# Patient Record
Sex: Male | Born: 1950 | Race: White | Hispanic: No | Marital: Married | Smoking: Former smoker
Health system: Southern US, Community
[De-identification: ages and names within clinical notes are randomized; demographics above are authoritative.]

## PROBLEM LIST (undated history)

## (undated) DIAGNOSIS — H269 Unspecified cataract: Secondary | ICD-10-CM

## (undated) DIAGNOSIS — IMO0002 Reserved for concepts with insufficient information to code with codable children: Secondary | ICD-10-CM

## (undated) DIAGNOSIS — K449 Diaphragmatic hernia without obstruction or gangrene: Secondary | ICD-10-CM

## (undated) DIAGNOSIS — T7840XA Allergy, unspecified, initial encounter: Secondary | ICD-10-CM

## (undated) DIAGNOSIS — N189 Chronic kidney disease, unspecified: Secondary | ICD-10-CM

## (undated) DIAGNOSIS — K219 Gastro-esophageal reflux disease without esophagitis: Secondary | ICD-10-CM

## (undated) DIAGNOSIS — N200 Calculus of kidney: Secondary | ICD-10-CM

## (undated) DIAGNOSIS — K635 Polyp of colon: Secondary | ICD-10-CM

## (undated) HISTORY — DX: Polyp of colon: K63.5

## (undated) HISTORY — DX: Diaphragmatic hernia without obstruction or gangrene: K44.9

## (undated) HISTORY — DX: Reserved for concepts with insufficient information to code with codable children: IMO0002

## (undated) HISTORY — PX: UPPER GASTROINTESTINAL ENDOSCOPY: SHX188

## (undated) HISTORY — DX: Chronic kidney disease, unspecified: N18.9

## (undated) HISTORY — PX: CERVICAL FUSION: SHX112

## (undated) HISTORY — PX: ROTATOR CUFF REPAIR: SHX139

## (undated) HISTORY — DX: Allergy, unspecified, initial encounter: T78.40XA

## (undated) HISTORY — PX: NISSEN FUNDOPLICATION: SHX2091

## (undated) HISTORY — PX: COLONOSCOPY: SHX174

## (undated) HISTORY — DX: Gastro-esophageal reflux disease without esophagitis: K21.9

## (undated) HISTORY — DX: Unspecified cataract: H26.9

## (undated) HISTORY — DX: Calculus of kidney: N20.0

---

## 1998-12-11 ENCOUNTER — Ambulatory Visit (HOSPITAL_COMMUNITY): Admission: RE | Admit: 1998-12-11 | Discharge: 1998-12-11 | Payer: Self-pay | Admitting: Gastroenterology

## 2000-03-10 ENCOUNTER — Encounter: Payer: Self-pay | Admitting: Gastroenterology

## 2000-03-10 ENCOUNTER — Ambulatory Visit (HOSPITAL_COMMUNITY): Admission: RE | Admit: 2000-03-10 | Discharge: 2000-03-10 | Payer: Self-pay | Admitting: Gastroenterology

## 2001-07-11 ENCOUNTER — Ambulatory Visit (HOSPITAL_COMMUNITY): Admission: RE | Admit: 2001-07-11 | Discharge: 2001-07-11 | Payer: Self-pay | Admitting: Gastroenterology

## 2001-08-09 ENCOUNTER — Encounter: Payer: Self-pay | Admitting: General Surgery

## 2001-08-12 ENCOUNTER — Inpatient Hospital Stay (HOSPITAL_COMMUNITY): Admission: RE | Admit: 2001-08-12 | Discharge: 2001-08-13 | Payer: Self-pay | Admitting: General Surgery

## 2004-02-04 ENCOUNTER — Encounter: Admission: RE | Admit: 2004-02-04 | Discharge: 2004-02-04 | Payer: Self-pay | Admitting: Gastroenterology

## 2004-02-12 ENCOUNTER — Encounter: Admission: RE | Admit: 2004-02-12 | Discharge: 2004-02-12 | Payer: Self-pay | Admitting: Gastroenterology

## 2006-08-25 ENCOUNTER — Ambulatory Visit: Payer: Self-pay | Admitting: Internal Medicine

## 2006-08-25 LAB — CONVERTED CEMR LAB
ALT: 62 units/L — ABNORMAL HIGH (ref 0–40)
AST: 33 units/L (ref 0–37)
Albumin: 3.7 g/dL (ref 3.5–5.2)
Alkaline Phosphatase: 89 units/L (ref 39–117)
Anti Nuclear Antibody(ANA): NEGATIVE
Ferritin: 226.9 ng/mL (ref 22.0–322.0)

## 2007-07-07 ENCOUNTER — Ambulatory Visit (HOSPITAL_BASED_OUTPATIENT_CLINIC_OR_DEPARTMENT_OTHER): Admission: RE | Admit: 2007-07-07 | Discharge: 2007-07-07 | Payer: Self-pay | Admitting: Urology

## 2010-01-12 ENCOUNTER — Encounter: Admission: RE | Admit: 2010-01-12 | Discharge: 2010-01-12 | Payer: Self-pay | Admitting: Chiropractic Medicine

## 2010-10-13 NOTE — Op Note (Signed)
NAMEABED, SCHAR               ACCOUNT NO.:  [id number]   MEDICAL RECORD NO.:  [id number]          PATIENT TYPE:  AMB   LOCATION:  [facility]                         FACILITY:  [facility]   PHYSICIAN:  Mark C. Vernie Ammons, M.D.  DATE OF BIRTH:  29-Mar-1951   DATE OF PROCEDURE:  07/07/2007  DATE OF DISCHARGE:                               OPERATIVE REPORT   PREOPERATIVE DIAGNOSES:  Bilateral spermatoceles and gross hematuria.   POSTOPERATIVE DIAGNOSES:  1. Bilateral spermatoceles.  2. Bilateral hydroceles.  3. Urethral condyloma.   PROCEDURE:  1. Cystoscopy.  2. Fulguration of urethral condyloma.  3. Scrotal exploration.  4. Bilateral hydrocelectomy.  5. Bilateral spermatocelectomy.   SURGEON:  Dr. Ihor Gully.   ANESTHESIA:  General.   SPECIMENS:  None.   BLOOD LOSS:  Minimal.   COMPLICATIONS:  None.   INDICATIONS:  The patient is a 60 year old white male who has a history  of bilateral spermatoceles.  The right side is a little smaller but is  tender and the left side is larger. He has elected to have these  surgically treated and in addition in the preop holding area, he  indicated he was noting some blood spotting in his underwear.  He  reports a history of urethral condyloma approximately 10 years ago. The  risks, complications and alternatives were discussed.  He understands  and elected to proceed.   DESCRIPTION OF OPERATION:  After informed consent, the patient was  brought to the major OR, placed on the table,  administered general  anesthesia in the supine position and his genitalia was sterilely  prepped and draped.  An official time-out was then performed.   Flexible cystoscopy was then performed using a 17-French flexible scope.  Under direct visualization, the scope was passed down the urethra which  was noted to be entirely normal without stricture and the sphincter  appeared intact.  The prostatic urethra was free of lesions and  nonobstructing.  The bladder  was then entered and fully inspected.  It  was noted to be free of any tumor, stones or inflammatory lesions.  The  ureteral orifices were normal in configuration and position.  The  cystoscope was then removed and inspection of the urethral meatus was  performed as I saw no lesions in the urethra.  I did note a small (2 mm)  condylomatous lesion in the urethral meatus at the 6 o'clock position.  This was grasped with Adson's and the urethral meatus was spread using  nasal speculum.  I then used the Bovie to bovie the lesion off at its  base and completely removing the lesion with no bleeding occurring.   Attention was then directed to the scrotum where a midline median raphe  incision was then made and carried down to expose the parietal tunica  surrounding the right testicle.  It was noted to be translucent  containing apparent fluid and when it was opened a moderate hydrocele  was identified and drained of clear amber fluid.  I then opened this  further and delivered the right testicle.  The testicle itself was noted  to be entirely normal.  The epididymis appeared to be slightly displaced  from the testicle and a large spermatocele was identified.  I incised at  the border between the spermatocele and the epididymis circumferentially  and then was able to completely dissect the spermatocele away from the  surrounding tissue. A smaller spermatocele was then found near the head  of the epididymis and the tunica over this was incised, this was exposed  and excised as well.  I then replaced the testicle back into the  parietal tunica vaginalis and then closed this with running 3-0 chromic  suture and replaced the testicle, in its normal anatomic position, in  the right hemiscrotum.   Attention was then directed to the left side with a hydrocele being  detected on that side.  It was opened and drained of clear amber fluid  as well.  The testicle was also delivered and two larger  spermatoceles  were again seen that also appeared to be displacing the epididymis  slightly away from the testicle.  These were treated in identical  fashion to the to the right side with clear fluid being removed from  each of these.  There was another area with a small cystic lesion along  the lateral aspect of the epididymis that was unroofed and fulgurated.  The testicle was then replaced in its normal anatomic position and the  parietal tunica was closed over this again with running 3-0 chromic and  the testicle placed back in the left hemiscrotum.  I then injected 0.25%  Marcaine in the subcu tissue and closed the deep tissues with running 3-  0 chromic suture including the right and left sides as well as the  midline septum.  The skin was then closed with running 3-0 chromic and  collodion was applied as well as fluffed 4x4s and a scrotal support.  The patient was then awakened and taken to the recovery in stable  satisfactory condition.  He tolerated the procedure well. There were no  intraoperative complications.  He will be given a prescription for  Vicodin HP #36 and Keflex 500 mg #15 and follow-up in my office in 3-4  weeks.      Mark C. Vernie Ammons, M.D.  Electronically Signed     MCO/MEDQ  D:  07/07/2007  T:  07/08/2007  Job:  [id number]

## 2010-10-16 NOTE — Procedures (Signed)
[place]  Patient:    Trevor Potter, Trevor Potter Visit Number: [id number] MRN: [id number]          Service Type: END Location: ENDO Attending Physician:  Charmaine Downs Dictated by:   Vania Rea. Jarold Motto, M.D. [facility] Proc. Date: 07/24/01 Admit Date:  07/11/2001 Discharge Date: 07/11/2001   CC:         Ulyess Mort, M.D. [facility]  Adolph Pollack, M.D.   Procedure Report  PROCEDURE PERFORMED:  Esophageal manometry.  ATTENDING PHYSICIAN:  Vania Rea. Jarold Motto, M.D. [facility]  Esophageal manometry was completed on July 11, 2001.  Results are as follows:  1 - Upper esophageal sphincter.  There appears to be normal coordination between pharyngeal contraction and cricopharyngeal relaxation.  2 - Lower esophageal sphincter.  Lower esophageal sphincter pressure was markedly decreased approximately 5 mHg with normal relaxation to swallowing. The lower esophageal sphincter appears to be 3.5 cm in length.  It measures from 44.5 cm to  48 cm.  3 - Motility pattern.  There are normally propagated peristaltic waves with normal amplitude and duration throughout the length of the esophagus to both wet and dry swallows.  Mean distal esophageal amplitude is 95 mHg.  ASSESSMENT:  This manometry is normal manometry except for a very incompetent lower esophageal sphincter pressure and I think this patient should do well with fundoplication surgery. Dictated by:   Vania Rea. Jarold Motto, M.D. [facility] Attending Physician:  Charmaine Downs DD:  07/24/01 TD:  07/24/01 Job: [id number]

## 2010-10-16 NOTE — Op Note (Signed)
[place] [facility]  Patient:    Trevor Potter, Trevor Potter Visit Number: [id number] MRN: [id number]          Service Type: SUR Location: 4W 0451 01 Attending Physician:  Arlis Porta Dictated by:   Adolph Pollack, M.D. Proc. Date: 08/11/01 Admit Date:  08/11/2001 Discharge Date: 08/13/2001   CC:         Trevor Potter, M.D. [facility]   Operative Report  PREOPERATIVE DIAGNOSIS:  Hiatal hernia with gastroesophageal reflux disease.  POSTOPERATIVE DIAGNOSIS:  Hiatal hernia with gastroesophageal reflux disease.  PROCEDURE:  Laparoscopic Nissen fundoplication and hiatal hernia repair.  SURGEON:  Adolph Pollack, M.D.  ASSISTANT:  Angelia Mould. Derrell Lolling, M.D.  ANESTHESIA:  General.  INDICATION:  Trevor Potter is a 60 year old man with known gastroesophageal reflux disease and hiatal hernia.  He has esophagitis.  Manometry demonstrated a decreased lower esophageal sphincter pressure.  He still has difficulty with reflux despite medical therapy and now presents of antireflux operation.  The procedure and the risks were explained to him preoperatively.  Also of note, preoperatively, he had some elevation of his liver function tests, but he states that Dr. Corinda Potter has noted this and has been evaluating him for this but has not found a specific cause for it.  TECHNIQUE:  He is brought to the operating room, placed supine on the operating table, and a general anesthetic was administered.  The abdominal wall was shaved and sterilely prepped and draped.  Dilute Marcaine was infiltrated in the subumbilical region, and an incision made in the skin and subcutaneous tissue.  The midline fascia was identified and incised.  Using blunt dissection, the peritoneal cavity was entered bluntly and under direct vision.  A pursestring suture of 0 Vicryl was placed around the fascial edges. A Hasson trocar was introduced to the peritoneal cavity and pneumoperitoneum created by  insufflation of CO2 gas.  Next, the 30 degree laparoscope was introduced.  Liver looked normal.  There were some adhesions from the omentum to the gallbladder.  Stomach appeared normal and the anterior surface of it.  Under direct vision, a 5 mm trocar was placed in the right mid abdomen through which a liver retractor was inserted.  After the left lobe of the liver was retracted superiorly and toward the right shoulder, the gastroesophageal junction could be visualized.  A 5 mm retractor and an 11 mm retractor was therefore then placed in the right subcostal region.  A 5 mm retractor was placed in the left subcostal region.  Gastroesophageal junction was grasped and retracted toward the left.  The thin gastrohepatic omentum was incised all the way up to the level of the right crus.  The anterior phrenicoesophageal ligament was incised across to the left crus.  The left crus was then mobilized free from the esophagus.  I then mobilized the right crus free from the esophagus and began the partial creation of the retroesophageal window.  Next, I approached the mid portion of the fundus, grasped it, and divided short gastric vessels.  The stomach was fairly adherent to the spleen, but I was able to mobilize this with the harmonic scalpel.  I skeletonized the left crus.  I was then able to create the retroesophageal window.  I noted that he had a small to moderate sized hiatal hernia.  I subsequently placed three 0 nonabsorbable sutures in the hiatal hernia, tied them down, and this closed the hernia.  It was not under any tension.  Next, I grasped  the proximal fundus and brought it through the retroesophageal window.  I then approximated the left leaf to the right leaf of the wrap and did the shoe shine maneuver.  The wrap was under no tension.  Following this, a size 50 Bougie was passed down in the esophagus into the stomach.  I then performed a 360 degree fundoplication over the size 50  Bougie.  Three nonabsorbable sutures were used.  The first two sutures encompassed the left leaf of the wrap, the anterior surface of the esophagus, and the right leaf in the wrap.  The third suture just encompassed the right and left leafs of the wrap.  The dilator was then removed.  The wrap was floppy and under no tension.  It measured approximately 2-2.5 cm.  I then inspected the area and noted no bleeding.  The wrap sutures lay at approximately the 11 oclock position, and I marked the two proximal sutures with the hemoclips.  I then removed the liver retractor, then removed the trocars, and released the pneumoperitoneum.  The supraumbilical fascia defect was closed by tightening up and tying down the pursestring suture.  The right subcostal 11 mm port site was closed using a single 0 Vicryl to close the fascial defect.  The skin was then closed with 4-0 Monocryl subcuticular stitches followed by Steri-Strips and sterile dressings.  He tolerated the procedure well without any apparent complications.  He was taken to the recovery room in satisfactory condition. Dictated by:   Adolph Pollack, M.D. Attending Physician:  Arlis Porta DD:  08/11/01 TD:  08/14/01 Job: [id number]

## 2011-02-09 ENCOUNTER — Encounter: Payer: Self-pay | Admitting: Gastroenterology

## 2011-02-19 LAB — POCT HEMOGLOBIN-HEMACUE
Hemoglobin: 15.1
Operator id: 114531

## 2011-03-02 ENCOUNTER — Ambulatory Visit (AMBULATORY_SURGERY_CENTER): Payer: BC Managed Care – PPO

## 2011-03-02 VITALS — Ht 73.0 in | Wt 257.7 lb

## 2011-03-02 DIAGNOSIS — Z1211 Encounter for screening for malignant neoplasm of colon: Secondary | ICD-10-CM

## 2011-03-02 DIAGNOSIS — Z8371 Family history of colonic polyps: Secondary | ICD-10-CM

## 2011-03-02 MED ORDER — PEG-KCL-NACL-NASULF-NA ASC-C 100 G PO SOLR: 1.0000 | Freq: Once | ORAL | Status: AC

## 2011-03-03 ENCOUNTER — Encounter: Payer: Self-pay | Admitting: Gastroenterology

## 2011-03-12 ENCOUNTER — Ambulatory Visit (AMBULATORY_SURGERY_CENTER): Payer: BC Managed Care – PPO | Admitting: Gastroenterology

## 2011-03-12 ENCOUNTER — Encounter: Payer: Self-pay | Admitting: Gastroenterology

## 2011-03-12 DIAGNOSIS — K5730 Diverticulosis of large intestine without perforation or abscess without bleeding: Secondary | ICD-10-CM

## 2011-03-12 DIAGNOSIS — Z1211 Encounter for screening for malignant neoplasm of colon: Secondary | ICD-10-CM

## 2011-03-12 DIAGNOSIS — D126 Benign neoplasm of colon, unspecified: Secondary | ICD-10-CM

## 2011-03-12 DIAGNOSIS — Z86010 Personal history of colonic polyps: Secondary | ICD-10-CM

## 2011-03-12 DIAGNOSIS — K635 Polyp of colon: Secondary | ICD-10-CM

## 2011-03-12 HISTORY — DX: Polyp of colon: K63.5

## 2011-03-12 MED ORDER — SODIUM CHLORIDE 0.9 % IV SOLN: 500.0000 mL | INTRAVENOUS | Status: DC

## 2011-03-12 NOTE — Patient Instructions (Signed)
Please refer to your blue and neon green sheets for instructions regarding diet and activity for the rest of today.  You may resume your medications as you would normally take them.   Diverticulosis Diverticulosis is a common condition that develops when small pouches (diverticula) form in the wall of the colon. The risk of diverticulosis increases with age. It happens more often in people who eat a low-fiber diet. Most individuals with diverticulosis have no symptoms. Those individuals with symptoms usually experience belly (abdominal) pain, constipation, or loose stools (diarrhea). HOME CARE INSTRUCTIONS  Increase the amount of fiber in your diet as directed by your caregiver or dietician. This may reduce symptoms of diverticulosis.   Your caregiver may recommend taking a dietary fiber supplement.   Drink at least 6 to 8 glasses of water each day to prevent constipation.   Try not to strain when you have a bowel movement.   Your caregiver may recommend avoiding nuts and seeds to prevent complications, although this is still an uncertain benefit.   Only take over-the-counter or prescription medicines for pain, discomfort, or fever as directed by your caregiver.  FOODS HAVING HIGH FIBER CONTENT INCLUDE:  Fruits. Apple, peach, pear, tangerine, raisins, prunes.   Vegetables. Brussels sprouts, asparagus, broccoli, cabbage, carrot, cauliflower, romaine lettuce, spinach, summer squash, tomato, winter squash, zucchini.   Starchy Vegetables. Baked beans, kidney beans, lima beans, split peas, lentils, potatoes (with skin).   Grains. Whole wheat bread, brown rice, bran flake cereal, plain oatmeal, white rice, shredded wheat, bran muffins.  SEEK IMMEDIATE MEDICAL CARE IF:  You develop increasing pain or severe bloating.   You have an increased oral temperature, not controlled by medicine.   You develop vomiting or bowel movements that are bloody or black.  Document Released: 02/12/2004  Document Re-Released: 11/04/2009 [facility] Patient Information 2011 [place], [place].  Polyps, Colon  A polyp is extra tissue that grows inside your body. Colon polyps grow in the large intestine. The large intestine, also called the colon, is part of your digestive system. It is a long, hollow tube at the end of your digestive tract where your body makes and stores stool. Most polyps are not dangerous. They are benign. This means they are not cancerous. But over time, some types of polyps can turn into cancer. Polyps that are smaller than a pea are usually not harmful. But larger polyps could someday become or may already be cancerous. To be safe, doctors remove all polyps and test them.  WHO GETS POLYPS? Anyone can get polyps, but certain people are more likely than others. You may have a greater chance of getting polyps if:  You are over 50.   You have had polyps before.   Someone in your family has had polyps.   Someone in your family has had cancer of the large intestine.   Find out if someone in your family has had polyps. You may also be more likely to get polyps if you:   Eat a lot of fatty foods   Smoke   Drink alcohol   Do not exercise  Eat too much  SYMPTOMS Most small polyps do not cause symptoms. People often do not know they have one until their caregiver finds it during a regular checkup or while testing them for something else. Some people do have symptoms like these:  Bleeding from the anus. You might notice blood on your underwear or on toilet paper after you have had a bowel movement.   Constipation  or diarrhea that lasts more than a week.   Blood in the stool. Blood can make stool look black or it can show up as red streaks in the stool.  If you have any of these symptoms, see your caregiver. HOW DOES THE DOCTOR TEST FOR POLYPS? The doctor can use four tests to check for polyps:  Digital rectal exam. The caregiver wears gloves and checks your rectum (the last  part of the large intestine) to see if it feels normal. This test would find polyps only in the rectum. Your caregiver may need to do one of the other tests listed below to find polyps higher up in the intestine.   Barium enema. The caregiver puts a liquid called barium into your rectum before taking x-rays of your large intestine. Barium makes your intestine look white in the pictures. Polyps are dark, so they are easy to see.   Sigmoidoscopy. With this test, the caregiver can see inside your large intestine. A thin flexible tube is placed into your rectum. The device is called a sigmoidoscope, which has a light and a tiny video camera in it. The caregiver uses the sigmoidoscope to look at the last third of your large intestine.   Colonoscopy. This test is like sigmoidoscopy, but the caregiver looks at all of the large intestine. It usually requires sedation. This is the most common method for finding and removing polyps.  TREATMENT  The caregiver will remove the polyp during sigmoidoscopy or colonoscopy. The polyp is then tested for cancer.   If you have had polyps, your caregiver may want you to get tested regularly in the future.  PREVENTION There is not one sure way to prevent polyps. You might be able to lower your risk of getting them if you:  Eat more fruits and vegetables and less fatty food.   Do not smoke.   Avoid alcohol.   Exercise every day.   Lose weight if you are overweight.   Eating more calcium and folate can also lower your risk of getting polyps. Some foods that are rich in calcium are milk, cheese, and broccoli. Some foods that are rich in folate are chickpeas, kidney beans, and spinach.   Aspirin might help prevent polyps. Studies are under way.  Document Released: 02/11/2004 Document Re-Released: 11/04/2009 [facility] Patient Information 2011 [place], [place].

## 2011-03-15 ENCOUNTER — Telehealth: Payer: Self-pay | Admitting: *Deleted

## 2011-03-15 NOTE — Telephone Encounter (Signed)
No id on machine, no message left  

## 2012-07-18 ENCOUNTER — Ambulatory Visit (INDEPENDENT_AMBULATORY_CARE_PROVIDER_SITE_OTHER): Payer: BC Managed Care – PPO | Admitting: Physician Assistant

## 2012-07-18 ENCOUNTER — Telehealth: Payer: Self-pay | Admitting: Gastroenterology

## 2012-07-18 ENCOUNTER — Other Ambulatory Visit (INDEPENDENT_AMBULATORY_CARE_PROVIDER_SITE_OTHER): Payer: BC Managed Care – PPO

## 2012-07-18 ENCOUNTER — Encounter: Payer: Self-pay | Admitting: *Deleted

## 2012-07-18 VITALS — BP 124/84 | HR 72 | Ht 73.0 in | Wt 239.8 lb

## 2012-07-18 DIAGNOSIS — R1011 Right upper quadrant pain: Secondary | ICD-10-CM

## 2012-07-18 DIAGNOSIS — K5730 Diverticulosis of large intestine without perforation or abscess without bleeding: Secondary | ICD-10-CM

## 2012-07-18 DIAGNOSIS — R1013 Epigastric pain: Secondary | ICD-10-CM

## 2012-07-18 DIAGNOSIS — R110 Nausea: Secondary | ICD-10-CM

## 2012-07-18 DIAGNOSIS — Z09 Encounter for follow-up examination after completed treatment for conditions other than malignant neoplasm: Secondary | ICD-10-CM

## 2012-07-18 DIAGNOSIS — Z9889 Other specified postprocedural states: Secondary | ICD-10-CM

## 2012-07-18 LAB — COMPREHENSIVE METABOLIC PANEL
ALT: 56 U/L — ABNORMAL HIGH (ref 0–53)
Albumin: 3.9 g/dL (ref 3.5–5.2)
Alkaline Phosphatase: 89 U/L (ref 39–117)
Calcium: 9.4 mg/dL (ref 8.4–10.5)
Chloride: 109 mEq/L (ref 96–112)
Glucose, Bld: 89 mg/dL (ref 70–99)
Potassium: 4.4 mEq/L (ref 3.5–5.1)
Sodium: 141 mEq/L (ref 135–145)
Total Protein: 6.4 g/dL (ref 6.0–8.3)

## 2012-07-18 LAB — LIPASE: Lipase: 32 U/L (ref 11.0–59.0)

## 2012-07-18 LAB — CBC WITH DIFFERENTIAL/PLATELET
Eosinophils Absolute: 0.2 10*3/uL (ref 0.0–0.7)
Lymphs Abs: 1.7 10*3/uL (ref 0.7–4.0)
MCHC: 34.4 g/dL (ref 30.0–36.0)
MCV: 93 fl (ref 78.0–100.0)
Monocytes Relative: 4.9 % (ref 3.0–12.0)
RBC: 4.63 Mil/uL (ref 4.22–5.81)
WBC: 8.2 10*3/uL (ref 4.5–10.5)

## 2012-07-18 MED ORDER — OMEPRAZOLE 20 MG PO CPDR: 20.0000 mg | DELAYED_RELEASE_CAPSULE | Freq: Two times a day (BID) | ORAL | Status: DC

## 2012-07-18 NOTE — Progress Notes (Signed)
Reviewed and agree with management. Danyella Mcginty D. Florencia Zaccaro, M.D., FACG  

## 2012-07-18 NOTE — Telephone Encounter (Signed)
Left message for pt to call back.  Pt states he has been having a lot of abdominal pain, gas, and sharp pain under his rib cage on the right side. Pt thinks it may be his gallbladder. Pt requesting to be seen. Pt scheduled to see Mike Gip PA today at 11am. Pt aware of appt date and time.

## 2012-07-18 NOTE — Patient Instructions (Addendum)
Please go to the basement level to have your labs drawn.  We have given you Prilosec 20 mg, Take 1 tab  30 min before breakfast. Today you can take 1 tab now and then 1 tab before dinner tonight.  We scheduled the Ultrasound at [facility] Radiology first floor for tomorrow 2-19 at 8 Am. Arrive at 7:45 am.  Have nothing by mouth after midnight.

## 2012-07-18 NOTE — Progress Notes (Signed)
Subjective:    Patient ID: Trevor Potter, male    DOB: August 30, 1950, 62 y.o.   MRN: [id number]  HPI Trevor Potter is a very nice 62 year old white male known to Dr. Arlyce Dice from prior colonoscopy. 62 He had colonoscopy in 2012 which showed mild diverticular disease of the sigmoid colon and one 3 mm polyp was removed this was a benign prolapse type polyp. He is status post remote Nissen fundoplication per Dr. Abbey Chatters approximally 10 years ago and says that this has worked very well and he still does not have any recurrent reflux symptoms. He relates that he was told at the time of his Nissen fundoplication that his gallbladder appeared scarred and would probably eventually need to come out. He says he has been having pain in the epigastrium and right upper quadrant over the past 4-5 days persistently ,this is a new pain that he has not ever had before. He describes it as burning, pressure and a bloated sensation sometimes sharp in the right upper quadrant. He has not had any radiation to his back or chest. He says he feels better if he eats more frequently and this seems to ease the pain, however within an hour or so it comes right back again. He has tried over-the-counter Gaviscon etc. - says hasn't touched it. He has been nauseated intermittently and wishes that he could vomit but has  unable been unable to vomit since his Nissen. No changes in his bowel habits melena or hematochezia. No documented fever or chills. He takes occasional NSAIDs but not on a regular basis and one baby aspirin per day. He is worried about his gallbladder and says the pain has been persistent and quite uncomfortable.    Review of Systems  Constitutional: Negative.   HENT: Negative.   Eyes: Negative.   Respiratory: Negative.   Cardiovascular: Negative.   Gastrointestinal: Positive for nausea and abdominal pain.  Endocrine: Negative.   Genitourinary: Negative.   Allergic/Immunologic: Negative.   Neurological: Negative.    Hematological: Negative.   Psychiatric/Behavioral: Negative.    Allergies  Allergen Reactions  . Ivp Dye (Iodinated Diagnostic Agents) Nausea And Vomiting  . Aleve (Naproxen Sodium) Rash       Outpatient Prescriptions Prior to Visit  Medication Sig Dispense Refill  . aspirin 81 MG tablet Take 81 mg by mouth daily.         No facility-administered medications prior to visit.   Patient Active Problem List  Diagnosis  . S/P Nissen fundoplication (without gastrostomy tube) procedure  . Diverticulosis of colon without hemorrhage   History  Substance Use Topics  . Smoking status: Former Smoker    Types: Cigarettes    Quit date: 03/01/1989  . Smokeless tobacco: Never Used  . Alcohol Use: 0.0 oz/week     Comment: 1-2 drinks daily   family history includes Cirrhosis in his father; Clotting disorder in his mother; and Heart disease in his brother and sister.  There is no history of Colon cancer and Colon polyps.  Objective:   Physical Exam well-developed white male in no acute distress, pleasant blood pressure 124/84 pulse 72 height 6 foot 1 weight 239. HEENT; nontraumatic normocephalic EOMI PERRLA sclera anicteric,Neck; Supple no JVD, Cardiovascular; regular rate and rhythm with S1-S2 no murmur or gallop, Pulmonary; clear bilaterally, Abdomen; soft he is tender in the epigastrium and right upper quadrant no guarding or rebound no palpable mass or hepatosplenomegaly bowel sounds are present, Rectal; exam not done, Extremities; no clubbing cyanosis or edema skin  warm and dry, Psych; mood and affect normal and appropriate        Assessment & Plan:  #54 62 year old male status post remote Nissen fundoplication, otherwise in good health now presenting with acute epigastric and right upper quadrant abdominal pain associated with nausea over the past 4-5 days. Will rule out gallbladder disease versus gastritis or peptic ulcer disease. #2 mild sigmoid diverticulosis #3 colon neoplasia  surveillance-up-to-date with colonoscopy last done 2012 with only one small benign prolapse type polyp  Plan; will check CBC with differential, CMET and lipase  Bland diet Prilosec OTC 20 mg by mouth daily in the short-term Schedule for upper abdominal ultrasound in the next 24 hours, further plans pending results of labs and ultrasound.

## 2012-07-19 ENCOUNTER — Ambulatory Visit (HOSPITAL_COMMUNITY)
Admission: RE | Admit: 2012-07-19 | Discharge: 2012-07-19 | Disposition: A | Discharge status: Home / Self Care | Payer: BC Managed Care – PPO | Source: Ambulatory Visit | Attending: Physician Assistant | Admitting: Physician Assistant

## 2012-07-19 DIAGNOSIS — Q619 Cystic kidney disease, unspecified: Secondary | ICD-10-CM | POA: Insufficient documentation

## 2012-07-19 DIAGNOSIS — R1011 Right upper quadrant pain: Secondary | ICD-10-CM

## 2012-07-19 DIAGNOSIS — R110 Nausea: Secondary | ICD-10-CM

## 2012-07-19 DIAGNOSIS — R1013 Epigastric pain: Secondary | ICD-10-CM

## 2012-10-30 ENCOUNTER — Other Ambulatory Visit: Payer: Self-pay | Admitting: Neurosurgery

## 2012-10-30 DIAGNOSIS — M5412 Radiculopathy, cervical region: Secondary | ICD-10-CM

## 2012-11-24 ENCOUNTER — Ambulatory Visit
Admission: RE | Admit: 2012-11-24 | Discharge: 2012-11-24 | Disposition: A | Discharge status: Home / Self Care | Payer: BC Managed Care – PPO | Source: Ambulatory Visit | Attending: Neurosurgery | Admitting: Neurosurgery

## 2012-11-24 VITALS — BP 118/73 | HR 64

## 2012-11-24 DIAGNOSIS — M5412 Radiculopathy, cervical region: Secondary | ICD-10-CM

## 2012-11-24 MED ORDER — IOHEXOL 300 MG/ML  SOLN
10.0000 mL | Freq: Once | INTRAMUSCULAR | Status: AC | PRN
  Administered 2012-11-24: 10 mL via INTRATHECAL

## 2012-11-24 MED ORDER — DIAZEPAM 5 MG PO TABS
10.0000 mg | ORAL_TABLET | Freq: Once | ORAL | Status: AC
  Administered 2012-11-24: 10 mg via ORAL

## 2013-09-18 ENCOUNTER — Other Ambulatory Visit: Payer: Self-pay | Admitting: Dermatology

## 2015-01-27 IMAGING — CT CT CERVICAL SPINE W/ CM
3 of 4 series · 13 of 33 positions shown, 16 images · non-contrast
Comparison: none

CLINICAL DATA: Cervical radiculopathy.  Prior discontinuous
cervical fusions with neck pain.
TECHNIQUE: Contiguous axial images were obtained through the
Cervical spine without infusion. Coronal and sagittal
reconstructions were obtained of the axial image sets.

[Series 2: c spine bone · axial · 0.27mm/px · z∈[-59,+73]mm · 5 of 81 slices shown, 7 images]
[im 14/81  soft-tissue]
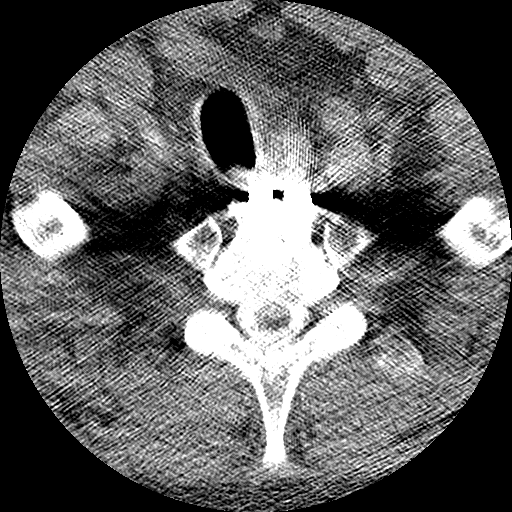
[im 14/81  bone]
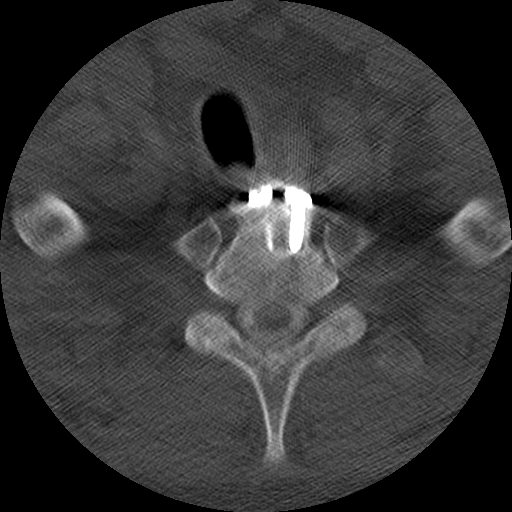
[im 27/81  bone]
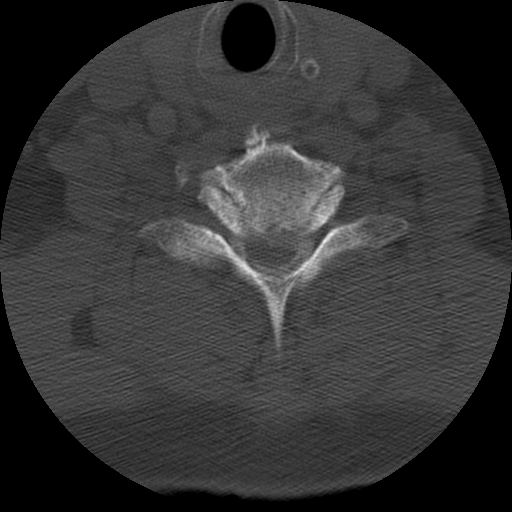
[im 41/81  bone]
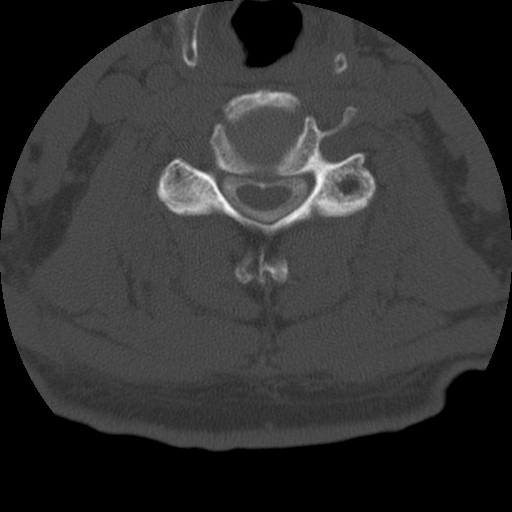
[im 54/81  bone]
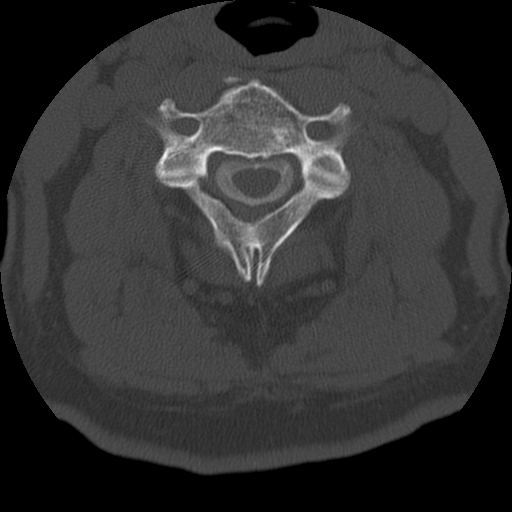
[im 67/81  soft-tissue]
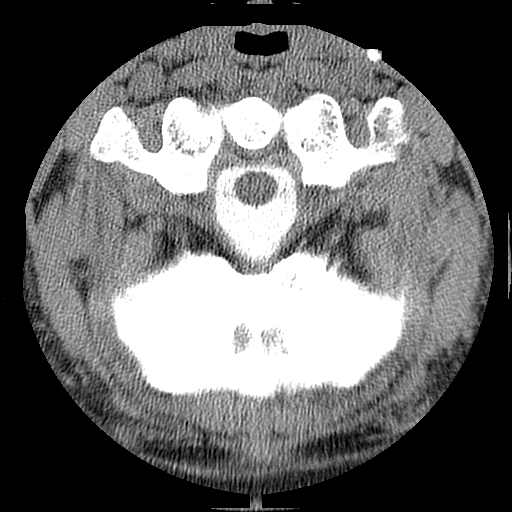
[im 67/81  bone]
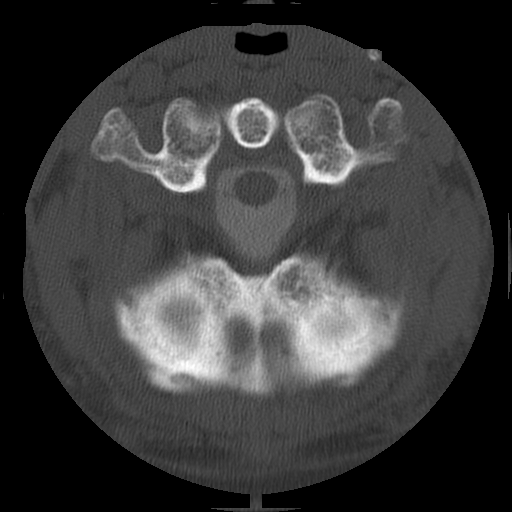

[Series 400: cor · coronal · 0.40mm/px · 3 of 50 slices shown]
[im 10/50  bone]
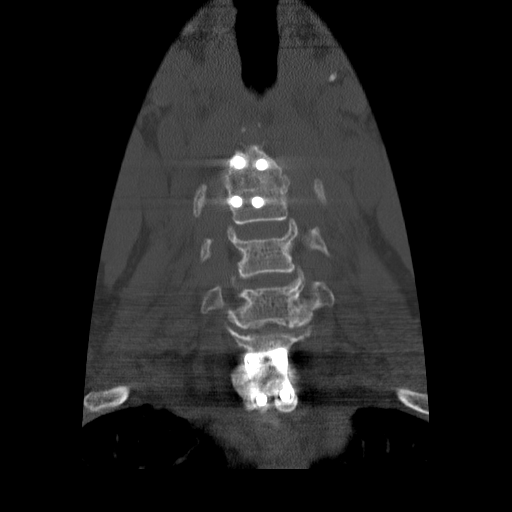
[im 20/50  bone]
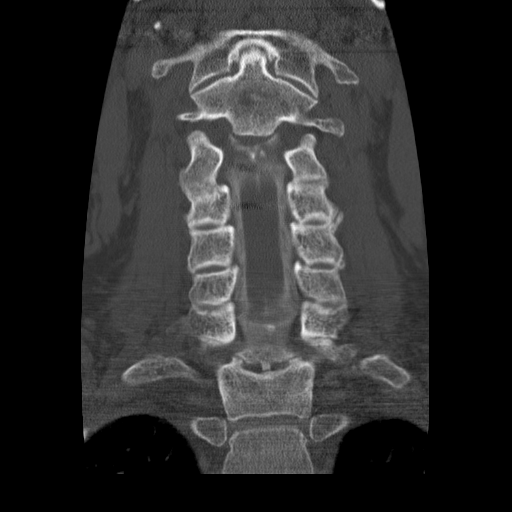
[im 30/50  bone]
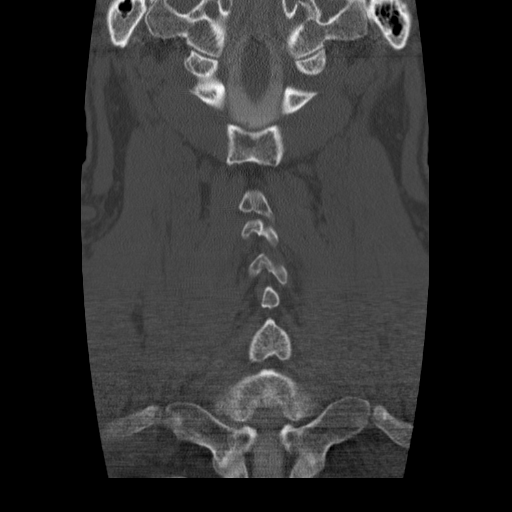

[Series 401: sag · sagittal · 0.40mm/px · 5 of 50 slices shown, 6 images]
[im 17/50  bone]
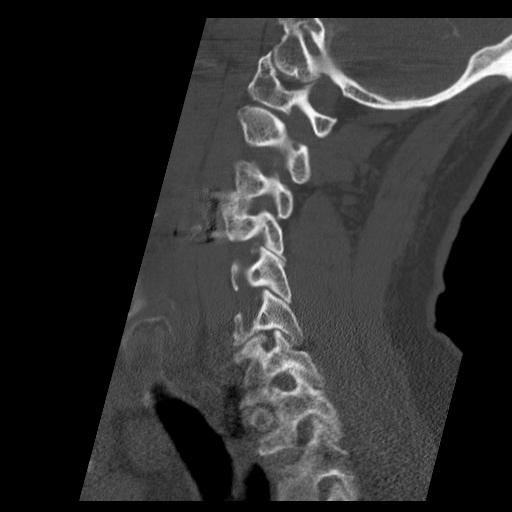
[im 21/50  bone]
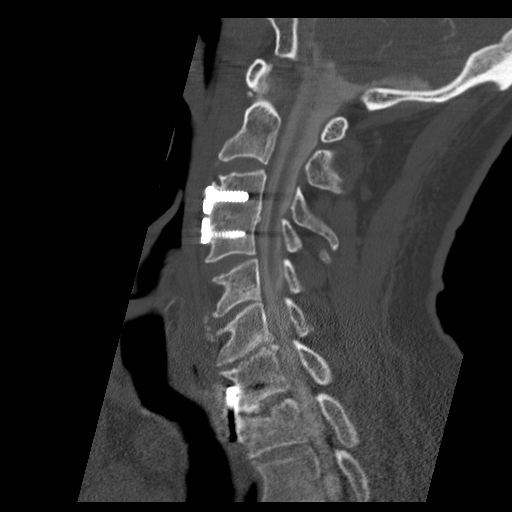
[im 25/50  soft-tissue]
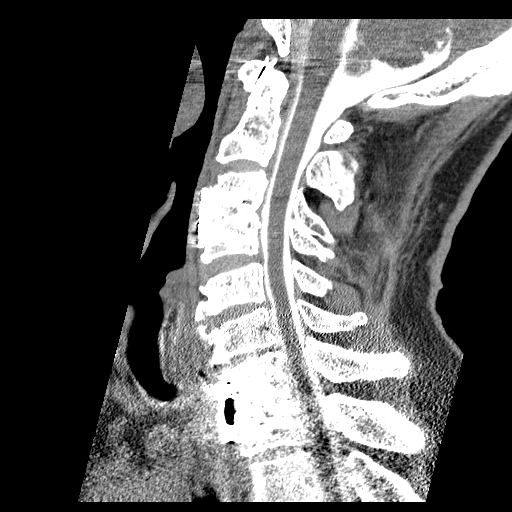
[im 25/50  bone]
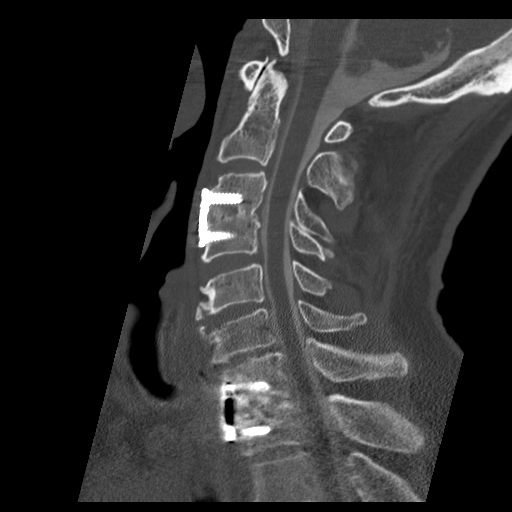
[im 29/50  bone]
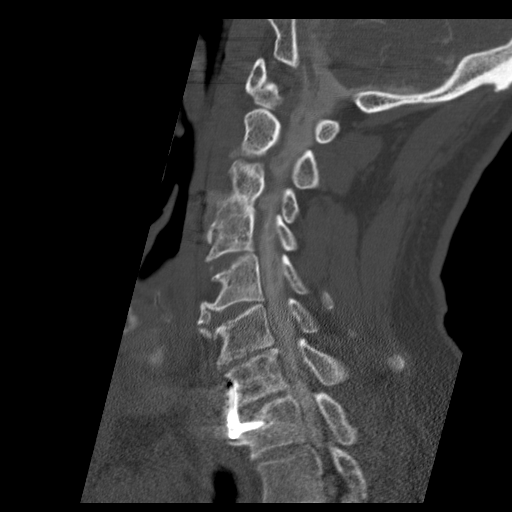
[im 33/50  bone]
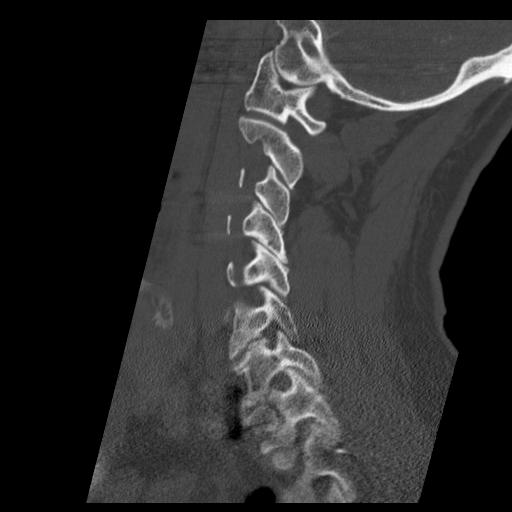

[13 of 33 positions shown; findings below may reference images not displayed]

LUMBAR PUNCTURE FOR CERVICAL MYELOGRAM

 Procedure: After thorough discussion of risks and benefits of the
procedure including bleeding, infection, injury to nerves, blood
vessels, adjacent structures as well as headache and CSF leak,
written and oral informed consent was obtained.   Consent was
obtained by Dr.Magdaleno. We discussed the high likelihood of obtaining
a diagnostic study.

Patient was positioned prone on the fluoroscopy table. Local
anesthesia was provided with 1% lidocaine without epinephrine after
prepped and draped in the usual sterile fashion. Puncture was
performed at L3-L4 using a 3-1/2 inches pencil point 22-gauge
spinal needle via right paramedian approach.  Using a single pass
through the dura, the needle was placed within the thecal sac, with
return of clear CSF. 8 mL of Bmnipaque-1BB was injected into the
thecal sac, with normal opacification of the nerve roots and cauda
equina consistent with free flow within the subarachnoid space.

Fluoroscopy time: 1 minute 34 seconds

I personally performed the lumbar puncture and administered the
intrathecal contrast. I also personally supervised acquisition of
the myelogram images.
FINDINGS: Solid C3-C4 cervical fusion is present.  No hardware
complication.  The the C7-T1 fusion is poorly visualized due to
overlapping soft tissues and will be evaluated further CT.  There
is no spondylolisthesis identified.  No instability identified with
flexion and extension of the cervical spine.  Degenerative disc
disease is moderate to severe at C5-C6 and C6-C7.  Prominent
anterior spurring is present.  C4-C5 adjacent segment facet
arthrosis is present.  Mild central stenosis is noted C5-C6 with
disc osteophyte complex producing anterior impression on the thecal
sac.
IMPRESSION: 1.  Technically successful lumbar puncture at L3-L4 with pencil
point needle.
2.  Solid C3-C4 fusion with ACDF plate.
3.  C5-C6 and C6-C7 cervical spondylosis which will be detailed
further below.
4.  Poor visualization of C7-T1 ACDF.

CT CERVICAL MYELOGRAM
FINDINGS: There is no cervical spondylolisthesis.  No aggressive
osseous lesions.  Paraspinal soft tissues appear within normal
limits.  Lung apices are normal.  Craniocervical alignment normal.
Atlantodental degenerative disease.

C2-C3:  Normal facet joints.  No stenosis.  Disc height preserved.

C3-C4:  Solid fusion.  No hardware complication.  No recurrent
stenosis.

C4-C5:  Small central disc protrusion with minimal central
stenosis.  No cord deformity.  Left greater than right facet
degeneration which is mild. Tiny vacuum joint present bilaterally.
The foramina appear adequately patent.  Mild central osseous
ridging involving the inferior C4.

C5-C6:  Disc degeneration with vacuum disc and collapse of the disc
space.  Mild central stenosis with shallow right eccentric broad-
based bulging.  AP diameter of the thecal sac is 9 mm.  There is
mild left foraminal encroachment associated with uncovertebral
spurring.

C6-C7:  Mild central stenosis associated with broad-based disc
osteophyte complex.  There is bilateral foraminal stenosis
associated with uncovertebral spurring. There is mild flattening
the ventral cord associated with broad-based disc osteophyte
complex.

C7-T1:  There is no solid fusion.  There is no gas within the disc
space to suggest pseudoarthrosis.  There is a tiny amount of
lucency around the T1 ACDF screws raising the possibility of
ongoing motion.  There may be a small amount of anterior bridging
bone (image number 11 series [DATE]) adjacent to the ACDF plate.
There is no recurrent central stenosis.  The foramina appear
adequately patent.  Mild left greater than right facet
degeneration.

T1-T2:  Grossly normal.  Suboptimal evaluation due to artifact.
IMPRESSION: 1.  Solid C3-C4 fusion without recurrent stenosis.
2.  Mild C5-C6 degenerative disc disease with mild central
stenosis.  Very mild left foraminal encroachment of questionable
significance.
3.  C6-C7 disc degeneration with broad-based disc osteophyte
complex producing mild central stenosis with flattening the ventral
cervical cord. Bilateral foraminal stenosis, greater on the right
than left associated with uncovertebral spurring.
4.  Delayed union at C7-T1 ACDF.  Faint lucency is present around
the T1 screws suggesting ongoing motion.  There appears to be a
tiny amount of bridging bone anteriorly across the disc space.  No
recurrent stenosis.

## 2016-10-18 ENCOUNTER — Observation Stay (HOSPITAL_COMMUNITY)
Admission: EM | Admit: 2016-10-18 | Discharge: 2016-10-19 | Disposition: A | Discharge status: Home / Self Care | Payer: BLUE CROSS/BLUE SHIELD | Attending: Internal Medicine | Admitting: Internal Medicine

## 2016-10-18 ENCOUNTER — Encounter (HOSPITAL_COMMUNITY): Payer: Self-pay | Admitting: Emergency Medicine

## 2016-10-18 ENCOUNTER — Emergency Department (HOSPITAL_COMMUNITY): Payer: BLUE CROSS/BLUE SHIELD

## 2016-10-18 DIAGNOSIS — N182 Chronic kidney disease, stage 2 (mild): Secondary | ICD-10-CM | POA: Diagnosis present

## 2016-10-18 DIAGNOSIS — I2510 Atherosclerotic heart disease of native coronary artery without angina pectoris: Secondary | ICD-10-CM | POA: Diagnosis not present

## 2016-10-18 DIAGNOSIS — I129 Hypertensive chronic kidney disease with stage 1 through stage 4 chronic kidney disease, or unspecified chronic kidney disease: Secondary | ICD-10-CM | POA: Insufficient documentation

## 2016-10-18 DIAGNOSIS — R030 Elevated blood-pressure reading, without diagnosis of hypertension: Secondary | ICD-10-CM | POA: Insufficient documentation

## 2016-10-18 DIAGNOSIS — Z8249 Family history of ischemic heart disease and other diseases of the circulatory system: Secondary | ICD-10-CM | POA: Diagnosis not present

## 2016-10-18 DIAGNOSIS — Z86010 Personal history of colonic polyps: Secondary | ICD-10-CM | POA: Diagnosis not present

## 2016-10-18 DIAGNOSIS — K219 Gastro-esophageal reflux disease without esophagitis: Secondary | ICD-10-CM | POA: Insufficient documentation

## 2016-10-18 DIAGNOSIS — I259 Chronic ischemic heart disease, unspecified: Secondary | ICD-10-CM | POA: Diagnosis not present

## 2016-10-18 DIAGNOSIS — Z87891 Personal history of nicotine dependence: Secondary | ICD-10-CM | POA: Diagnosis not present

## 2016-10-18 DIAGNOSIS — Z886 Allergy status to analgesic agent status: Secondary | ICD-10-CM | POA: Insufficient documentation

## 2016-10-18 DIAGNOSIS — R072 Precordial pain: Principal | ICD-10-CM | POA: Insufficient documentation

## 2016-10-18 DIAGNOSIS — Z91041 Radiographic dye allergy status: Secondary | ICD-10-CM | POA: Diagnosis not present

## 2016-10-18 DIAGNOSIS — K5730 Diverticulosis of large intestine without perforation or abscess without bleeding: Secondary | ICD-10-CM | POA: Diagnosis not present

## 2016-10-18 DIAGNOSIS — R079 Chest pain, unspecified: Secondary | ICD-10-CM | POA: Diagnosis not present

## 2016-10-18 DIAGNOSIS — M542 Cervicalgia: Secondary | ICD-10-CM | POA: Diagnosis not present

## 2016-10-18 DIAGNOSIS — Z7982 Long term (current) use of aspirin: Secondary | ICD-10-CM | POA: Insufficient documentation

## 2016-10-18 DIAGNOSIS — Z79899 Other long term (current) drug therapy: Secondary | ICD-10-CM | POA: Insufficient documentation

## 2016-10-18 LAB — BASIC METABOLIC PANEL
Anion gap: 8 (ref 5–15)
BUN: 17 mg/dL (ref 6–20)
CHLORIDE: 108 mmol/L (ref 101–111)
CO2: 24 mmol/L (ref 22–32)
Calcium: 9.1 mg/dL (ref 8.9–10.3)
Creatinine, Ser: 1.3 mg/dL — ABNORMAL HIGH (ref 0.61–1.24)
GFR calc Af Amer: >60 mL/min (ref >60)
GFR calc non Af Amer: 56 mL/min — ABNORMAL LOW (ref >60)
GLUCOSE: 101 mg/dL — AB (ref 65–99)
POTASSIUM: 4 mmol/L (ref 3.5–5.1)
Sodium: 140 mmol/L (ref 135–145)

## 2016-10-18 LAB — CBC
HEMATOCRIT: 43.1 % (ref 39.0–52.0)
Hemoglobin: 15.1 g/dL (ref 13.0–17.0)
MCH: 31.9 pg (ref 26.0–34.0)
MCHC: 35 g/dL (ref 30.0–36.0)
MCV: 91.1 fL (ref 78.0–100.0)
Platelets: 245 10*3/uL (ref 150–400)
RBC: 4.73 MIL/uL (ref 4.22–5.81)
RDW: 12.8 % (ref 11.5–15.5)
WBC: 9.7 10*3/uL (ref 4.0–10.5)

## 2016-10-18 LAB — I-STAT TROPONIN, ED: Troponin i, poc: 0 ng/mL (ref 0.00–0.08)

## 2016-10-18 LAB — TROPONIN I

## 2016-10-18 MED ORDER — LORATADINE 10 MG PO TABS
10.0000 mg | ORAL_TABLET | Freq: Every day | ORAL | Status: DC
  Administered 2016-10-19: 10 mg via ORAL
  Filled 2016-10-18: qty 1

## 2016-10-18 MED ORDER — ACETAMINOPHEN 325 MG PO TABS: 650.0000 mg | ORAL_TABLET | ORAL | Status: DC | PRN

## 2016-10-18 MED ORDER — MORPHINE SULFATE (PF) 4 MG/ML IV SOLN: 2.0000 mg | INTRAVENOUS | Status: DC | PRN

## 2016-10-18 MED ORDER — ASPIRIN 81 MG PO CHEW: 81.0000 mg | CHEWABLE_TABLET | Freq: Every day | ORAL | Status: DC

## 2016-10-18 MED ORDER — ENOXAPARIN SODIUM 40 MG/0.4ML ~~LOC~~ SOLN
40.0000 mg | SUBCUTANEOUS | Status: DC
  Administered 2016-10-18: 40 mg via SUBCUTANEOUS
  Filled 2016-10-18: qty 0.4

## 2016-10-18 MED ORDER — ONDANSETRON HCL 4 MG/2ML IJ SOLN: 4.0000 mg | Freq: Four times a day (QID) | INTRAMUSCULAR | Status: DC | PRN

## 2016-10-18 MED ORDER — ASPIRIN EC 81 MG PO TBEC
243.0000 mg | DELAYED_RELEASE_TABLET | Freq: Once | ORAL | Status: AC
  Administered 2016-10-18: 243 mg via ORAL
  Filled 2016-10-18: qty 3

## 2016-10-18 NOTE — ED Triage Notes (Signed)
Pt complaint of intermittent left/mid chest pressure/tightness with associated ringing in left ear and just not feeling good since Thursday.

## 2016-10-18 NOTE — H&P (Signed)
Trevor Potter DUK:[id number] DOB: December 29, 1950 DOA: 10/18/2016     PCP: Briscoe Deutscher, MD   Outpatient Specialists: [facility]  Patient coming from: home Lives   With family   Chief Complaint: Chest pain  HPI: Trevor Potter is a 66 y.o. male with medical history significant of  remote Nissen fundoplication 3762, GERD, tobacco abuse in the past, CKD    Presented with 4 day history of intermittent versus mid chest pain worse with exertion and sometimes associated ringing in his ear overall he has not been feeling well for past few days. No dyspnea, today had some tingling in left arm.  Today he felt something was not right fatigued. Reports good PO intake.  This episodes occurring at rest as well feels a dull versus pressure-like.  When it happened so last few hours but then goes away all by itself.  Pain is mild today but lasted for few hours.  He did had some sweating associated this. She reported that his brother had a heart attack when he was 47s in that worries him.  He himself never had any coronary artery disease that he knows of.  Has not had any cough nausea vomiting denies headache DVT his arms. he has been told in the past that his blood pressure been elevated but he does not take any medicines for it. He has been working in the yard lately and some times when he carries heavy things he has had some muscle pains in his chest.  Currently chest pain free Reports last stress test was 20 yeas ago and was negative.  IN ER:  Temp (24hrs), Avg:98.4 F (36.9 C), Min:98.4 F (36.9 C), Max:98.4 F (36.9 C)    RR 12 satting 96% HR 69 BP 128/82 NA 140 K4.0 creatinine 1.3 up from 1.2 in 2014 Troponin 0.00 WBC 9.7 hemoglobin 15.1 platelets 245 Chest x-ray nonacute Following Medications were ordered in ER: Medications  aspirin EC tablet 243 mg (243 mg Oral Given 10/18/16 1831)     Hospitalist was called for admission for chest pain evaluation  Review of Systems:    Pertinent positives  include: Diaphoresis. fatigue, chest pain,   Constitutional:  No weight loss, night sweats, Fevers, chills,  weight loss  HEENT:  No headaches, Difficulty swallowing,Tooth/dental problems,Sore throat,  No sneezing, itching, ear ache, nasal congestion, post nasal drip,  Cardio-vascular:  NoOrthopnea, PND, anasarca, dizziness, palpitations.no Bilateral lower extremity swelling  GI:  No heartburn, indigestion, abdominal pain, nausea, vomiting, diarrhea, change in bowel habits, loss of appetite, melena, blood in stool, hematemesis Resp:  no shortness of breath at rest. No dyspnea on exertion, No excess mucus, no productive cough, No non-productive cough, No coughing up of blood.No change in color of mucus.No wheezing. Skin:  no rash or lesions. No jaundice GU:  no dysuria, change in color of urine, no urgency or frequency. No straining to urinate.  No flank pain.  Musculoskeletal:  No joint pain or no joint swelling. No decreased range of motion. No back pain.  Psych:  No change in mood or affect. No depression or anxiety. No memory loss.  Neuro: no localizing neurological complaints, no tingling, no weakness, no double vision, no gait abnormality, no slurred speech, no confusion  As per HPI otherwise 10 point review of systems negative.   Past Medical History: Past Medical History:  Diagnosis Date  . Chronic kidney disease   . Colon polyp 03/12/2011   Sigmoid polyp  . GERD (gastroesophageal reflux disease)   .  Ulcer    Past Surgical History:  Procedure Laterality Date  . CERVICAL FUSION     with plates and screws  . COLONOSCOPY    . NISSEN FUNDOPLICATION    . UPPER GASTROINTESTINAL ENDOSCOPY       Social History:  Ambulatory  independently      reports that he quit smoking about 27 years ago. His smoking use included Cigarettes. He has never used smokeless tobacco. He reports that he drinks alcohol. He reports that he does not use drugs.  Allergies:   Allergies    Allergen Reactions  . Ivp Dye [Iodinated Diagnostic Agents] Nausea And Vomiting  . Aleve [Naproxen Sodium] Itching and Rash     Family History:   Family History  Problem Relation Age of Onset  . Clotting disorder Mother   . Cirrhosis Father   . Heart disease Sister   . Heart disease Brother   . Colon cancer Neg Hx   . Colon polyps Neg Hx     Medications: Prior to Admission medications   Medication Sig Start Date End Date Taking? Authorizing Provider  aspirin 81 MG tablet Take 81 mg by mouth daily.     Yes [provider]  cetirizine (ZYRTEC) 10 MG tablet Take 10 mg by mouth daily.   Yes [provider]  Multiple Vitamin (MULTIVITAMIN) tablet Take 1 tablet by mouth daily as needed.   Yes [provider]  omeprazole (PRILOSEC) 20 MG capsule Take 1 capsule (20 mg total) by mouth 2 (two) times daily. 07/18/12 07/18/13  Alfredia Ferguson, PA-C    Physical Exam: Patient Vitals for the past 24 hrs:  BP Temp Temp src Pulse Resp SpO2  10/18/16 1800 128/82 - - 69 12 96 %  10/18/16 1730 128/87 - - 77 16 96 %  10/18/16 1618 (!) 149/85 98.4 F (36.9 C) Oral 83 16 97 %    1. General:  in No Acute distress 2. Psychological: Alert and   Oriented 3. Head/ENT:   Dry Mucous Membranes                          Head Non traumatic, neck supple                          Normal  Dentition 4. SKIN:  decreased Skin turgor,  Skin clean Dry and intact no rash 5. Heart: Regular rate and rhythm no  Murmur, Rub or gallop 6. Lungs: no wheezes or crackles   7. Abdomen: Soft,  non-tender, Non distended 8. Lower extremities: no clubbing, cyanosis, or edema 9. Neurologically Grossly intact, moving all 4 extremities equally  10. MSK: Normal range of motion   body mass index is unknown because there is no height or weight on file.  Labs on Admission:   Labs on Admission: I have personally reviewed following labs and imaging studies  CBC:  Recent Labs Lab 10/18/16 1627   WBC 9.7  HGB 15.1  HCT 43.1  MCV 91.1  PLT 448   Basic Metabolic Panel:  Recent Labs Lab 10/18/16 1627  NA 140  K 4.0  CL 108  CO2 24  GLUCOSE 101*  BUN 17  CREATININE 1.30*  CALCIUM 9.1   GFR: CrCl cannot be calculated (Unknown ideal weight.). Liver Function Tests: No results for input(s): AST, ALT, ALKPHOS, BILITOT, PROT, ALBUMIN in the last 168 hours. No results for input(s): LIPASE, AMYLASE in  the last 168 hours. No results for input(s): AMMONIA in the last 168 hours. Coagulation Profile: No results for input(s): INR, PROTIME in the last 168 hours. Cardiac Enzymes: No results for input(s): CKTOTAL, CKMB, CKMBINDEX, TROPONINI in the last 168 hours. BNP (last 3 results) No results for input(s): PROBNP in the last 8760 hours. HbA1C: No results for input(s): HGBA1C in the last 72 hours. CBG: No results for input(s): GLUCAP in the last 168 hours. Lipid Profile: No results for input(s): CHOL, HDL, LDLCALC, TRIG, CHOLHDL, LDLDIRECT in the last 72 hours. Thyroid Function Tests: No results for input(s): TSH, T4TOTAL, FREET4, T3FREE, THYROIDAB in the last 72 hours. Anemia Panel: No results for input(s): VITAMINB12, FOLATE, FERRITIN, TIBC, IRON, RETICCTPCT in the last 72 hours. Urine analysis: No results found for: COLORURINE, APPEARANCEUR, LABSPEC, PHURINE, GLUCOSEU, HGBUR, BILIRUBINUR, KETONESUR, PROTEINUR, UROBILINOGEN, NITRITE, LEUKOCYTESUR Sepsis Labs: @LABRCNTIP (procalcitonin:4,lacticidven:4) )No results found for this or any previous visit (from the past 240 hour(s)).     UA  not ordered  No results found for: HGBA1C  CrCl cannot be calculated (Unknown ideal weight.).  BNP (last 3 results) No results for input(s): PROBNP in the last 8760 hours.   ECG REPORT  Independently reviewed Rate: 85  Rhythm: NSR ST&T Change: Poor R wave progression QTC 419  There were no vitals filed for this visit.   Cultures: No results found for: SDES, [place],  CULT, REPTSTATUS   Radiological Exams on Admission: Dg Chest 2 View  Result Date: 10/18/2016 CLINICAL DATA:  Intermittent left chest pain EXAM: CHEST  2 VIEW COMPARISON:  None. FINDINGS: The heart size and mediastinal contours are within normal limits. Both lungs are clear. The visualized skeletal structures are unremarkable. IMPRESSION: No acute abnormality noted. Electronically Signed   By: Inez Catalina M.D.   On: 10/18/2016 18:26    Chart has been reviewed    Assessment/Plan   66 y.o. male with medical history significant of  remote Nissen fundoplication 4315, GERD, tobacco abuse in the past, CKD admitted for chest pain evaluation  Present on Admission: . Chest pain - - given risk factors will admit, monitor on telemetry, cycle cardiac enzymes, obtain serial ECG. Further risk stratify with lipid panel, hgA1C, obtain TSH. Make sure patient is on Aspirin. Further treatment based on the currently pending results.  We will email cardiology given risk factors and chest pain worsened by exertion . CKD (chronic kidney disease), stage II stable currently at baseline GERD - stable  Other plan as per orders.  DVT prophylaxis:   Lovenox     Code Status:  FULL CODE care as per patient    Family Communication:   Family  at  Bedside  plan of care was discussed with Wife,    Disposition Plan:     To home once workup is complete and patient is stable                             Consults called: email cardiology   Admission status:  obs   Level of care     tele          I have spent a total of 56 min on this admission  Jerimiah Wolman 10/18/2016, 8:24 PM    [facility]  Pager [phone]   after 2 AM please page floor coverage PA If 7AM-7PM, please contact the day team taking care of the patient  [email or web address]  Password [email or web address]

## 2016-10-18 NOTE — ED Provider Notes (Signed)
[facility] DEPT Provider Note   CSN: [id number] Arrival date & time: 10/18/16  1610     History   Chief Complaint Chief Complaint  Patient presents with  . Chest Pain    HPI Trevor Potter is a 66 y.o. male 1 week of intermittent left-sided chest pain. He describes pain as a "dull, pressure" to the left side and midsternal chest region. He states that episodes of pain have occurred with rest and with activity and he denies any particular event that in folks for pain. He does note that when he has the chest pain is worsened with exertion Has not affected by.. He states that the episodes of pain last several hours before spontaneously resolving. He has not tried any medications to help with resolution of pain. Today he reports that he has had left-sided chest pain for the last few hours. He states that it is very mild but did notice that he had some diaphoresis with the pain. He also reports that he is having some tingling to his left upper extremity that began today. He denies any recent heavy lifting or new exercise routines. Patient has history of GERD but has had a Nissen Fundoplication and states the current symptoms feel different than what he experienced GERD. He reports that he used to smoke a pack a day of cigarettes for 20 years but stopped 30 years ago. He reports that his brother had a heart attack when he was 43 and that several members of the family have cardiovascular disease. Patient denies a personal history of CAD or MI, hyperlipidemia, diabetes. He denies any recent fever or recent illness. Patient denies any recent travel, periods of immobilization, surgeries, personal history of blood clots in his legs or lungs.   The history is provided by the patient.    Past Medical History:  Diagnosis Date  . Chronic kidney disease   . Colon polyp 03/12/2011   Sigmoid polyp  . GERD (gastroesophageal reflux disease)   . Ulcer     Patient Active Problem List   Diagnosis Date  Noted  . Chest pain 10/18/2016  . Elevated blood pressure reading 10/18/2016  . S/P Nissen fundoplication (without gastrostomy tube) procedure 07/18/2012  . Diverticulosis of colon without hemorrhage 07/18/2012    Past Surgical History:  Procedure Laterality Date  . CERVICAL FUSION     with plates and screws  . COLONOSCOPY    . NISSEN FUNDOPLICATION    . UPPER GASTROINTESTINAL ENDOSCOPY         Home Medications    Prior to Admission medications   Medication Sig Start Date End Date Taking? Authorizing Provider  aspirin 81 MG tablet Take 81 mg by mouth daily.     Yes [provider]  cetirizine (ZYRTEC) 10 MG tablet Take 10 mg by mouth daily.   Yes [provider]  Multiple Vitamin (MULTIVITAMIN) tablet Take 1 tablet by mouth daily as needed.   Yes [provider]  omeprazole (PRILOSEC) 20 MG capsule Take 1 capsule (20 mg total) by mouth 2 (two) times daily. 07/18/12 07/18/13  Alfredia Ferguson, PA-C    Family History Family History  Problem Relation Age of Onset  . Clotting disorder Mother   . Cirrhosis Father   . Heart disease Sister   . Heart disease Brother   . Colon cancer Neg Hx   . Colon polyps Neg Hx     Social History Social History  Substance Use Topics  . Smoking status: Former Smoker  Types: Cigarettes    Quit date: 03/01/1989  . Smokeless tobacco: Never Used  . Alcohol use 0.0 oz/week     Comment: 1-2 drinks daily     Allergies   Ivp dye [iodinated diagnostic agents] and Aleve [naproxen sodium]   Review of Systems Review of Systems  Constitutional: Positive for diaphoresis. Negative for chills and fever.  HENT: Negative for congestion and sore throat.   Eyes: Negative for visual disturbance.  Respiratory: Negative for cough and shortness of breath.   Cardiovascular: Positive for chest pain.  Gastrointestinal: Negative for abdominal pain, blood in stool, diarrhea, nausea and vomiting.  Genitourinary: Negative for  dysuria and hematuria.  Musculoskeletal: Negative for back pain and neck pain.  Skin: Negative for rash.  Neurological: Negative for dizziness, weakness, numbness and headaches.  Psychiatric/Behavioral: Negative for confusion.  All other systems reviewed and are negative.    Physical Exam Updated Vital Signs BP 128/82   Pulse 69   Temp 98.4 F (36.9 C) (Oral)   Resp 12   SpO2 96%   Physical Exam  Constitutional: He is oriented to person, place, and time. He appears well-developed and well-nourished.  Sitting comfortably on examination table  HENT:  Head: Normocephalic and atraumatic.  Mouth/Throat: Oropharynx is clear and moist and mucous membranes are normal.  Eyes: Conjunctivae, EOM and lids are normal. Pupils are equal, round, and reactive to light.  Neck: Full passive range of motion without pain.  Cardiovascular: Normal rate, regular rhythm, normal heart sounds and normal pulses.  Exam reveals no gallop and no friction rub.   No murmur heard. Pulmonary/Chest: Effort normal and breath sounds normal. He exhibits tenderness.  Tenderness palpation to anterior left-sided chest.  Abdominal: Soft. Normal appearance. There is no tenderness. There is no rigidity and no guarding.  Musculoskeletal: Normal range of motion.  Neurological: He is alert and oriented to person, place, and time.  Cranial nerves III-XII intact Follows commands, Moves all extremities  5/5 strength to BUE and BLE  Sensation intact throughout  Normal finger to nose. No dysdiadochokinesia. No pronator drift. No slurred speech. No facial droop.   Skin: Skin is warm and dry. Capillary refill takes less than 2 seconds.  Psychiatric: He has a normal mood and affect. His speech is normal.  Nursing note and vitals reviewed.    ED Treatments / Results  Labs (all labs ordered are listed, but only abnormal results are displayed) Labs Reviewed  BASIC METABOLIC PANEL - Abnormal; Notable for the following:        Result Value   Glucose, Bld 101 (*)    Creatinine, Ser 1.30 (*)    GFR calc non Af Amer 56 (*)    All other components within normal limits  CBC  I-STAT TROPOININ, ED    EKG  EKG Interpretation  Date/Time:  Monday Oct 18 2016 16:17:02 EDT Ventricular Rate:  85 PR Interval:    QRS Duration: 89 QT Interval:  352 QTC Calculation: 419 R Axis:   3 Text Interpretation:  Sinus rhythm Abnormal R-wave progression, early transition No significant change since last tracing Confirmed by YAO  MD, DAVID ([id number]) on 10/18/2016 5:39:37 PM       Radiology Dg Chest 2 View  Result Date: 10/18/2016 CLINICAL DATA:  Intermittent left chest pain EXAM: CHEST  2 VIEW COMPARISON:  None. FINDINGS: The heart size and mediastinal contours are within normal limits. Both lungs are clear. The visualized skeletal structures are unremarkable. IMPRESSION: No acute abnormality noted. Electronically  Signed   By: Inez Catalina M.D.   On: 10/18/2016 18:26    Procedures Procedures (including critical care time)  Medications Ordered in ED Medications  aspirin EC tablet 243 mg (243 mg Oral Given 10/18/16 1831)     Initial Impression / Assessment and Plan / ED Course  I have reviewed the triage vital signs and the nursing notes.  Pertinent labs & imaging results that were available during my care of the patient were reviewed by me and considered in my medical decision making (see chart for details).     66 year old male who presents with 1 week of intermittent left-sided chest pain. Consider ACS versus musculoskeletal pain. History/physical exam are not concerning for GERD. History/physical exam are not concerning for PE. He is in is a Wells score is 0 and is at low risk for PE. Labs, EKG, troponin and chest x-ray ordered at triage. Patient offered analgesics but he declined at this time, stating that his pain is very mild he doesn't feel like he needs to take any pain medication. Will plan to give patient  aspirin.  EKG reviewed. NSR, rate 85. Poor R wave progression. Initial troponin negative. CBC within normal limits. BMP with mild bump in creatinine compared to 4 years ago.  Chest x-ray reviewed. Negative for any acute infectious etiology. Discussed patient with Dr. Darl Householder. Given symptoms and family history will plan to keep patient overnight for chest pain rule out.  6:38 PM: Consult to hospitalist placed  7:09 PM: Discussed with hospitalist. Will admit.   Final Clinical Impressions(s) / ED Diagnoses   Final diagnoses:  Chest pain, unspecified type    New Prescriptions New Prescriptions   No medications on file     Desma Mcgregor 10/19/16 0231    Drenda Freeze, MD 10/19/16 1346

## 2016-10-19 ENCOUNTER — Encounter (HOSPITAL_COMMUNITY)
Admission: EM | Disposition: A | Discharge status: Home / Self Care | Payer: Self-pay | Source: Home / Self Care | Attending: Emergency Medicine

## 2016-10-19 ENCOUNTER — Observation Stay (HOSPITAL_COMMUNITY): Payer: BLUE CROSS/BLUE SHIELD

## 2016-10-19 DIAGNOSIS — I259 Chronic ischemic heart disease, unspecified: Secondary | ICD-10-CM | POA: Diagnosis not present

## 2016-10-19 DIAGNOSIS — Z8249 Family history of ischemic heart disease and other diseases of the circulatory system: Secondary | ICD-10-CM

## 2016-10-19 DIAGNOSIS — R072 Precordial pain: Secondary | ICD-10-CM | POA: Diagnosis not present

## 2016-10-19 DIAGNOSIS — N182 Chronic kidney disease, stage 2 (mild): Secondary | ICD-10-CM

## 2016-10-19 DIAGNOSIS — E669 Obesity, unspecified: Secondary | ICD-10-CM | POA: Diagnosis not present

## 2016-10-19 DIAGNOSIS — I129 Hypertensive chronic kidney disease with stage 1 through stage 4 chronic kidney disease, or unspecified chronic kidney disease: Secondary | ICD-10-CM | POA: Diagnosis not present

## 2016-10-19 DIAGNOSIS — I2510 Atherosclerotic heart disease of native coronary artery without angina pectoris: Secondary | ICD-10-CM | POA: Diagnosis not present

## 2016-10-19 DIAGNOSIS — K219 Gastro-esophageal reflux disease without esophagitis: Secondary | ICD-10-CM | POA: Diagnosis not present

## 2016-10-19 DIAGNOSIS — R079 Chest pain, unspecified: Secondary | ICD-10-CM | POA: Diagnosis not present

## 2016-10-19 DIAGNOSIS — E785 Hyperlipidemia, unspecified: Secondary | ICD-10-CM

## 2016-10-19 HISTORY — PX: LEFT HEART CATH AND CORONARY ANGIOGRAPHY: CATH118249

## 2016-10-19 LAB — TSH: TSH: 2.332 u[IU]/mL (ref 0.350–4.500)

## 2016-10-19 LAB — LIPID PANEL
Cholesterol: 141 mg/dL (ref 0–200)
HDL: 37 mg/dL — ABNORMAL LOW (ref >40)
LDL CALC: 81 mg/dL (ref 0–99)
Total CHOL/HDL Ratio: 3.8 RATIO
Triglycerides: 117 mg/dL (ref <150)
VLDL: 23 mg/dL (ref 0–40)

## 2016-10-19 LAB — BASIC METABOLIC PANEL
Anion gap: 8 (ref 5–15)
BUN: 17 mg/dL (ref 6–20)
CHLORIDE: 107 mmol/L (ref 101–111)
CO2: 25 mmol/L (ref 22–32)
CREATININE: 1.24 mg/dL (ref 0.61–1.24)
Calcium: 8.8 mg/dL — ABNORMAL LOW (ref 8.9–10.3)
GFR calc Af Amer: >60 mL/min (ref >60)
GFR calc non Af Amer: 60 mL/min — ABNORMAL LOW (ref >60)
Glucose, Bld: 95 mg/dL (ref 65–99)
Potassium: 4 mmol/L (ref 3.5–5.1)
SODIUM: 140 mmol/L (ref 135–145)

## 2016-10-19 LAB — PROTIME-INR
INR: 1.05
PROTHROMBIN TIME: 13.7 s (ref 11.4–15.2)

## 2016-10-19 LAB — TROPONIN I

## 2016-10-19 LAB — HIV ANTIBODY (ROUTINE TESTING W REFLEX): HIV Screen 4th Generation wRfx: NONREACTIVE

## 2016-10-19 SURGERY — LEFT HEART CATH AND CORONARY ANGIOGRAPHY
Anesthesia: LOCAL

## 2016-10-19 MED ORDER — IOPAMIDOL (ISOVUE-370) INJECTION 76%
INTRAVENOUS | Status: AC
  Filled 2016-10-19: qty 100

## 2016-10-19 MED ORDER — HEPARIN (PORCINE) IN NACL 2-0.9 UNIT/ML-% IJ SOLN
INTRAMUSCULAR | Status: AC | PRN
  Administered 2016-10-19: 1000 mL

## 2016-10-19 MED ORDER — MIDAZOLAM HCL 2 MG/2ML IJ SOLN
INTRAMUSCULAR | Status: DC | PRN
  Administered 2016-10-19: 1 mg via INTRAVENOUS

## 2016-10-19 MED ORDER — LIDOCAINE HCL (PF) 1 % IJ SOLN
INTRAMUSCULAR | Status: DC | PRN
  Administered 2016-10-19: 2 mL

## 2016-10-19 MED ORDER — ATORVASTATIN CALCIUM 40 MG PO TABS: 80.0000 mg | ORAL_TABLET | Freq: Every day | ORAL | Status: DC

## 2016-10-19 MED ORDER — ATORVASTATIN CALCIUM 40 MG PO TABS
80.0000 mg | ORAL_TABLET | ORAL | Status: AC
  Administered 2016-10-19: 80 mg via ORAL
  Filled 2016-10-19: qty 2

## 2016-10-19 MED ORDER — ASPIRIN 81 MG PO CHEW: 81.0000 mg | CHEWABLE_TABLET | ORAL | Status: DC

## 2016-10-19 MED ORDER — SODIUM CHLORIDE 0.9% FLUSH: 3.0000 mL | Freq: Two times a day (BID) | INTRAVENOUS | Status: DC

## 2016-10-19 MED ORDER — DIPHENHYDRAMINE HCL 25 MG PO CAPS
25.0000 mg | ORAL_CAPSULE | Freq: Once | ORAL | Status: AC
  Administered 2016-10-19: 25 mg via ORAL
  Filled 2016-10-19: qty 1

## 2016-10-19 MED ORDER — VERAPAMIL HCL 2.5 MG/ML IV SOLN
INTRAVENOUS | Status: DC | PRN
  Administered 2016-10-19: 15:00:00 via INTRA_ARTERIAL

## 2016-10-19 MED ORDER — ASPIRIN 81 MG PO CHEW: 81.0000 mg | CHEWABLE_TABLET | Freq: Every day | ORAL | Status: DC

## 2016-10-19 MED ORDER — HEPARIN SODIUM (PORCINE) 1000 UNIT/ML IJ SOLN
INTRAMUSCULAR | Status: DC | PRN
  Administered 2016-10-19: 5000 [IU] via INTRAVENOUS

## 2016-10-19 MED ORDER — FENTANYL CITRATE (PF) 100 MCG/2ML IJ SOLN
INTRAMUSCULAR | Status: DC | PRN
  Administered 2016-10-19: 25 ug via INTRAVENOUS

## 2016-10-19 MED ORDER — SODIUM CHLORIDE 0.9 % IV SOLN: 250.0000 mL | INTRAVENOUS | Status: DC | PRN

## 2016-10-19 MED ORDER — SODIUM CHLORIDE 0.9% FLUSH: 3.0000 mL | INTRAVENOUS | Status: DC | PRN

## 2016-10-19 MED ORDER — HEPARIN SODIUM (PORCINE) 1000 UNIT/ML IJ SOLN
INTRAMUSCULAR | Status: AC
  Filled 2016-10-19: qty 1

## 2016-10-19 MED ORDER — SODIUM CHLORIDE 0.9 % WEIGHT BASED INFUSION
1.0000 mL/kg/h | INTRAVENOUS | Status: DC
  Administered 2016-10-19: 1 mL/kg/h via INTRAVENOUS

## 2016-10-19 MED ORDER — HEPARIN (PORCINE) IN NACL 2-0.9 UNIT/ML-% IJ SOLN
INTRAMUSCULAR | Status: AC
  Filled 2016-10-19: qty 1000

## 2016-10-19 MED ORDER — FENTANYL CITRATE (PF) 100 MCG/2ML IJ SOLN
INTRAMUSCULAR | Status: AC
  Filled 2016-10-19: qty 2

## 2016-10-19 MED ORDER — IOPAMIDOL (ISOVUE-370) INJECTION 76%
INTRAVENOUS | Status: DC | PRN
  Administered 2016-10-19: 65 mL via INTRA_ARTERIAL

## 2016-10-19 MED ORDER — SODIUM CHLORIDE 0.9 % WEIGHT BASED INFUSION: 1.0000 mL/kg/h | INTRAVENOUS | Status: DC

## 2016-10-19 MED ORDER — SODIUM CHLORIDE 0.9 % IV SOLN: INTRAVENOUS | Status: DC

## 2016-10-19 MED ORDER — MIDAZOLAM HCL 2 MG/2ML IJ SOLN
INTRAMUSCULAR | Status: AC
  Filled 2016-10-19: qty 2

## 2016-10-19 MED ORDER — ATORVASTATIN CALCIUM 80 MG PO TABS: 40.0000 mg | ORAL_TABLET | Freq: Every day | ORAL | 0 refills | Status: DC

## 2016-10-19 MED ORDER — VERAPAMIL HCL 2.5 MG/ML IV SOLN
INTRAVENOUS | Status: AC
  Filled 2016-10-19: qty 2

## 2016-10-19 MED ORDER — ASPIRIN 81 MG PO CHEW
324.0000 mg | CHEWABLE_TABLET | Freq: Once | ORAL | Status: AC
  Administered 2016-10-19: 324 mg via ORAL
  Filled 2016-10-19: qty 4

## 2016-10-19 MED ORDER — SODIUM CHLORIDE 0.9 % WEIGHT BASED INFUSION: 3.0000 mL/kg/h | INTRAVENOUS | Status: DC

## 2016-10-19 MED ORDER — PREDNISONE 20 MG PO TABS
60.0000 mg | ORAL_TABLET | Freq: Once | ORAL | Status: AC
  Administered 2016-10-19: 60 mg via ORAL
  Filled 2016-10-19: qty 3

## 2016-10-19 MED ORDER — LIDOCAINE HCL 1 % IJ SOLN
INTRAMUSCULAR | Status: AC
  Filled 2016-10-19: qty 20

## 2016-10-19 MED ORDER — SODIUM CHLORIDE 0.9 % WEIGHT BASED INFUSION
3.0000 mL/kg/h | INTRAVENOUS | Status: AC
  Administered 2016-10-19: 3 mL/kg/h via INTRAVENOUS

## 2016-10-19 SURGICAL SUPPLY — 11 items
CATH 5FR JL3.5 JR4 ANG PIG MP (CATHETERS) ×2 IMPLANT
DEVICE RAD COMP TR BAND LRG (VASCULAR PRODUCTS) ×2 IMPLANT
GLIDESHEATH SLEND SS 6F .021 (SHEATH) ×2 IMPLANT
GUIDEWIRE ANGLED .035X150CM (WIRE) ×2 IMPLANT
GUIDEWIRE INQWIRE 1.5J.035X260 (WIRE) ×1 IMPLANT
INQWIRE 1.5J .035X260CM (WIRE) ×2
KIT HEART LEFT (KITS) ×2 IMPLANT
PACK CARDIAC CATHETERIZATION (CUSTOM PROCEDURE TRAY) ×2 IMPLANT
TRANSDUCER W/STOPCOCK (MISCELLANEOUS) ×2 IMPLANT
TUBING CIL FLEX 10 FLL-RA (TUBING) ×2 IMPLANT
WIRE HI TORQ VERSACORE-J 145CM (WIRE) ×2 IMPLANT

## 2016-10-19 NOTE — Progress Notes (Signed)
PROGRESS NOTE  Trevor Potter OTL:[id number] DOB: [birth year] DOA: 10/18/2016 PCP: Briscoe Deutscher, MD  HPI/Recap of past 24 hours:  Denies pain, no sob, no cough, no fever, no lower extremity edema Wife at bedside  Assessment/Plan: Active Problems:   Chest pain   CKD (chronic kidney disease), stage II  Chest pain - chest pain worsened by exertion,  -cardiac enzymes negative, serial ECG no dynamic st/t changes. hdl 37, ldl 81, a1c pending, tsh wnl. Echo pending. -Continue asa, start statin,  -cardiology consulted, to have cardiac cath today at [facility] given risk factors .    CKD (chronic kidney disease), stage II stable currently at baseline  GERD - stable    DVT prophylaxis:   Lovenox     Code Status:  FULL CODE care as per patient    Family Communication:   wife  at  Bedside    Disposition Plan:     To home with cardiology clearance    Consultants:  cardiology  Procedures:  cardiac cath  Antibiotics:  none   Objective: BP 125/81 (BP Location: Right Arm)   Pulse 68   Temp 98.3 F (36.8 C) (Oral)   Resp 18   Ht 6\' 1"  (1.854 m)   Wt 108.9 kg (240 lb)   SpO2 97%   BMI 31.66 kg/m   Intake/Output Summary (Last 24 hours) at 10/19/16 1051 Last data filed at 10/19/16 0900  Gross per 24 hour  Intake                0 ml  Output                0 ml  Net                0 ml   Filed Weights   10/18/16 2114  Weight: 108.9 kg (240 lb)    Exam:   General:  NAD  Cardiovascular: RRR  Respiratory: CTABL  Abdomen: Soft/ND/NT, positive BS  Musculoskeletal: No Edema  Neuro: aaox3  Data Reviewed: Basic Metabolic Panel:  Recent Labs Lab 10/18/16 1627 10/19/16 0937  NA 140 140  K 4.0 4.0  CL 108 107  CO2 24 25  GLUCOSE 101* 95  BUN 17 17  CREATININE 1.30* 1.24  CALCIUM 9.1 8.8*   Liver Function Tests: No results for input(s): AST, ALT, ALKPHOS, BILITOT, PROT, ALBUMIN in the last 168 hours. No results for input(s): LIPASE, AMYLASE  in the last 168 hours. No results for input(s): AMMONIA in the last 168 hours. CBC:  Recent Labs Lab 10/18/16 1627  WBC 9.7  HGB 15.1  HCT 43.1  MCV 91.1  PLT 245   Cardiac Enzymes:    Recent Labs Lab 10/18/16 2146 10/19/16 0038 10/19/16 0429  TROPONINI <0.03 <0.03 <0.03   BNP (last 3 results) No results for input(s): BNP in the last 8760 hours.  ProBNP (last 3 results) No results for input(s): PROBNP in the last 8760 hours.  CBG: No results for input(s): GLUCAP in the last 168 hours.  No results found for this or any previous visit (from the past 240 hour(s)).   Studies: Dg Chest 2 View  Result Date: 10/18/2016 CLINICAL DATA:  Intermittent left chest pain EXAM: CHEST  2 VIEW COMPARISON:  None. FINDINGS: The heart size and mediastinal contours are within normal limits. Both lungs are clear. The visualized skeletal structures are unremarkable. IMPRESSION: No acute abnormality noted. Electronically Signed   By: Inez Catalina M.D.   On:  10/18/2016 18:26    Scheduled Meds: . [START ON 10/20/2016] aspirin  81 mg Oral Daily  . [START ON 10/20/2016] atorvastatin  80 mg Oral Q0600  . diphenhydrAMINE  25 mg Oral Once  . enoxaparin (LOVENOX) injection  40 mg Subcutaneous Q24H  . loratadine  10 mg Oral Daily  . predniSONE  60 mg Oral Once  . sodium chloride flush  3 mL Intravenous Q12H    Continuous Infusions: . sodium chloride    . sodium chloride 3 mL/kg/hr (10/19/16 1027)   Followed by  . sodium chloride       Time spent: 25 mins  Jakie Debow MD, PhD  [facility] Pager [phone]. If 7PM-7AM, please contact night-coverage at [email or web address], password [facility] 10/19/2016, 10:51 AM  LOS: 0 days

## 2016-10-19 NOTE — H&P (View-Only) (Signed)
Cardiology Consult    Patient ID: Trevor Potter MRN: [id number], DOB/AGE: [birth year]   Admit date: 10/18/2016 Date of Consult: 10/19/2016  Primary Physician: Briscoe Deutscher, MD Primary Cardiologist: new - Dr. Marlou Porch Requesting Provider: Dr. Erlinda Hong  Reason for Consult: chest pain  Patient Profile    Trevor Potter is a 66 yo male with a PMH significant for CKD stage III, GERD s/p Nissen fundoplication, HTN, and diverticulosis. He presented to [facility] with chest pain.  Trevor Potter is a 66 y.o. male who is being seen today for the evaluation of chest pain at the request of Dr. Erlinda Hong.   Past Medical History   Past Medical History:  Diagnosis Date  . Chronic kidney disease   . Colon polyp 03/12/2011   Sigmoid polyp  . GERD (gastroesophageal reflux disease)   . Ulcer     Past Surgical History:  Procedure Laterality Date  . CERVICAL FUSION     with plates and screws  . COLONOSCOPY    . NISSEN FUNDOPLICATION    . UPPER GASTROINTESTINAL ENDOSCOPY       Allergies  Allergies  Allergen Reactions  . Ivp Dye [Iodinated Diagnostic Agents] Nausea And Vomiting  . Aleve [Naproxen Sodium] Itching and Rash    History of Present Illness    Trevor Potter presented to [facility] with chest pain that started approximately 5 days ago. At first, the pain presented when he was doing activities in the yard or with exertion and was relieved with rest. His chest pain episode yesterday happened while walking around an antique store. The pain is generally relieved in 20-30 min with rest. The pain is substernal with radiation to his left arm and left neck. It is rated as a 4-5/10 at its worst and is described as a dull pressure or band around his chest. He states he had a similar chest pain approximately 10 years ago and was referred to cardiology. He underwent a stress test at that time that was negative for ischemia.   He has a family history of heart disease, his brother had heart disease in his 59s. He also has a  history of tobacco use, but quit in 1990. He has a history of CKD stage III and IVP dye allergy.   Inpatient Medications    . aspirin  81 mg Oral Daily  . enoxaparin (LOVENOX) injection  40 mg Subcutaneous Q24H  . loratadine  10 mg Oral Daily     Outpatient Medications    Prior to Admission medications   Medication Sig Start Date End Date Taking? Authorizing Provider  aspirin 81 MG tablet Take 81 mg by mouth daily.     Yes [provider]  cetirizine (ZYRTEC) 10 MG tablet Take 10 mg by mouth daily.   Yes [provider]  Multiple Vitamin (MULTIVITAMIN) tablet Take 1 tablet by mouth daily as needed.   Yes [provider]  omeprazole (PRILOSEC) 20 MG capsule Take 1 capsule (20 mg total) by mouth 2 (two) times daily. 07/18/12 07/18/13  Alfredia Ferguson, PA-C     Family History    Family History  Problem Relation Age of Onset  . Clotting disorder Mother   . Cirrhosis Father   . Heart disease Sister   . Heart disease Brother   . Colon cancer Neg Hx   . Colon polyps Neg Hx     Social History    Social History   Social History  . Marital status: Married    Spouse  name: N/A  . Number of children: N/A  . Years of education: N/A   Occupational History  . Not on file.   Social History Main Topics  . Smoking status: Former Smoker    Types: Cigarettes    Quit date: 03/01/1989  . Smokeless tobacco: Never Used  . Alcohol use 0.0 oz/week     Comment: 1-2 drinks daily  . Drug use: No  . Sexual activity: Not on file   Other Topics Concern  . Not on file   Social History Narrative  . No narrative on file     Review of Systems    General:  No chills, fever, night sweats or weight changes.  Cardiovascular:  + chest pain, dyspnea on exertion, edema, orthopnea, palpitations, paroxysmal nocturnal dyspnea. Dermatological: No rash, lesions/masses Respiratory: No cough, dyspnea Urologic: No hematuria, dysuria Abdominal:   No nausea, vomiting,  diarrhea, bright red blood per rectum, melena, or hematemesis Neurologic:  No visual changes, wkns, changes in mental status. All other systems reviewed and are otherwise negative except as noted above.  Physical Exam    Blood pressure 108/71, pulse 66, temperature 98.4 F (36.9 C), temperature source Oral, resp. rate 18, height 6\' 1"  (1.854 m), weight 240 lb (108.9 kg), SpO2 95 %.  General: Pleasant, NAD Psych: Normal affect. Neuro: Alert and oriented X 3. Moves all extremities spontaneously. HEENT: Normal  Neck: Supple without bruits or JVD. Lungs:  Resp regular and unlabored, CTA. Heart: RRR no s3, s4, or murmurs. Abdomen: Soft, non-tender, non-distended, BS + x 4.  Extremities: No clubbing, cyanosis or edema. DP/PT/Radials 2+ and equal bilaterally.  Labs    Troponin [facility] of Care Test)  Recent Labs  10/18/16 1634  TROPIPOC 0.00    Recent Labs  10/18/16 2146 10/19/16 0038 10/19/16 0429  TROPONINI <0.03 <0.03 <0.03   Lab Results  Component Value Date   WBC 9.7 10/18/2016   HGB 15.1 10/18/2016   HCT 43.1 10/18/2016   MCV 91.1 10/18/2016   PLT 245 10/18/2016    Recent Labs Lab 10/18/16 1627  NA 140  K 4.0  CL 108  CO2 24  BUN 17  CREATININE 1.30*  CALCIUM 9.1  GLUCOSE 101*   No results found for: CHOL, HDL, LDLCALC, TRIG No results found for: [facility]   Radiology Studies    Dg Chest 2 View  Result Date: 10/18/2016 CLINICAL DATA:  Intermittent left chest pain EXAM: CHEST  2 VIEW COMPARISON:  None. FINDINGS: The heart size and mediastinal contours are within normal limits. Both lungs are clear. The visualized skeletal structures are unremarkable. IMPRESSION: No acute abnormality noted. Electronically Signed   By: Inez Catalina M.D.   On: 10/18/2016 18:26    ECG & Cardiac Imaging    EKG 10/18/16: NSR  Echocardiogram 10/19/16: pending   Assessment & Plan    1. Chest pain - troponin x 3 negative - EKG without signs of ischemia He has risk factors  for ACS, including family history (brother in his 71s) and history of tobacco use (quit in 1990). He describes typical anginal symptoms that occur with activity and are relieved with rest, radiating to the left chest and arm, band-like pressure, relieved with 20-30 min of rest.  Discussed the case with Dr. Marlou Porch. We will proceed with heart catheterization with possible PCI today. He has no upcoming surgical procedures scheduled and does not have a history of bleeding. He has a history of CKD stage III with sCr of 1.30 on  admission. Repeat BMP this morning is pending and I will start IV fluids. He also has a history of GI upset/vomiting with IVP dye. I ordered 60 mg prednisone and 25 mg benadryl PO pre-cath. The patient is in agreement with this plan.     Signed, Ledora Bottcher, PA-C 10/19/2016, 7:34 AM [phone]  Personally seen and examined. Agree with above.  69 year old with brother CAD, former smoker, with exertional CP with left arm radiation, relieved with rest. Concerning story. Trop, ECG unremarkable. Given pre-test probability, will proceed with cath. Risks and benefits discussed. IVF. Prior nausea with IVP dye. Will give pred PO and benadryl.   Lungs clear, obese, RRR, no rub, no JVD, 2+ right radial, large wrist.   Candee Furbish, MD

## 2016-10-19 NOTE — Consult Note (Signed)
Cardiology Consult    Patient ID: Trevor Potter MRN: [id number], DOB/AGE: May 28, 1951   Admit date: 10/18/2016 Date of Consult: 10/19/2016  Primary Physician: Briscoe Deutscher, MD Primary Cardiologist: new - Dr. Marlou Porch Requesting Provider: Dr. Erlinda Hong  Reason for Consult: chest pain  Patient Profile    Trevor Potter is a 66 yo male with a PMH significant for CKD stage III, GERD s/p Nissen fundoplication, HTN, and diverticulosis. He presented to [facility] with chest pain.  Trevor Potter is a 66 y.o. male who is being seen today for the evaluation of chest pain at the request of Dr. Erlinda Hong.   Past Medical History   Past Medical History:  Diagnosis Date  . Chronic kidney disease   . Colon polyp 03/12/2011   Sigmoid polyp  . GERD (gastroesophageal reflux disease)   . Ulcer     Past Surgical History:  Procedure Laterality Date  . CERVICAL FUSION     with plates and screws  . COLONOSCOPY    . NISSEN FUNDOPLICATION    . UPPER GASTROINTESTINAL ENDOSCOPY       Allergies  Allergies  Allergen Reactions  . Ivp Dye [Iodinated Diagnostic Agents] Nausea And Vomiting  . Aleve [Naproxen Sodium] Itching and Rash    History of Present Illness    Trevor Potter presented to [facility] with chest pain that started approximately 5 days ago. At first, the pain presented when he was doing activities in the yard or with exertion and was relieved with rest. His chest pain episode yesterday happened while walking around an antique store. The pain is generally relieved in 20-30 min with rest. The pain is substernal with radiation to his left arm and left neck. It is rated as a 4-5/10 at its worst and is described as a dull pressure or band around his chest. He states he had a similar chest pain approximately 10 years ago and was referred to cardiology. He underwent a stress test at that time that was negative for ischemia.   He has a family history of heart disease, his brother had heart disease in his 63s. He also has a  history of tobacco use, but quit in 1990. He has a history of CKD stage III and IVP dye allergy.   Inpatient Medications    . aspirin  81 mg Oral Daily  . enoxaparin (LOVENOX) injection  40 mg Subcutaneous Q24H  . loratadine  10 mg Oral Daily     Outpatient Medications    Prior to Admission medications   Medication Sig Start Date End Date Taking? Authorizing Provider  aspirin 81 MG tablet Take 81 mg by mouth daily.     Yes [provider]  cetirizine (ZYRTEC) 10 MG tablet Take 10 mg by mouth daily.   Yes [provider]  Multiple Vitamin (MULTIVITAMIN) tablet Take 1 tablet by mouth daily as needed.   Yes [provider]  omeprazole (PRILOSEC) 20 MG capsule Take 1 capsule (20 mg total) by mouth 2 (two) times daily. 07/18/12 07/18/13  Alfredia Ferguson, PA-C     Family History    Family History  Problem Relation Age of Onset  . Clotting disorder Mother   . Cirrhosis Father   . Heart disease Sister   . Heart disease Brother   . Colon cancer Neg Hx   . Colon polyps Neg Hx     Social History    Social History   Social History  . Marital status: Married    Spouse  name: N/A  . Number of children: N/A  . Years of education: N/A   Occupational History  . Not on file.   Social History Main Topics  . Smoking status: Former Smoker    Types: Cigarettes    Quit date: 03/01/1989  . Smokeless tobacco: Never Used  . Alcohol use 0.0 oz/week     Comment: 1-2 drinks daily  . Drug use: No  . Sexual activity: Not on file   Other Topics Concern  . Not on file   Social History Narrative  . No narrative on file     Review of Systems    General:  No chills, fever, night sweats or weight changes.  Cardiovascular:  + chest pain, dyspnea on exertion, edema, orthopnea, palpitations, paroxysmal nocturnal dyspnea. Dermatological: No rash, lesions/masses Respiratory: No cough, dyspnea Urologic: No hematuria, dysuria Abdominal:   No nausea, vomiting,  diarrhea, bright red blood per rectum, melena, or hematemesis Neurologic:  No visual changes, wkns, changes in mental status. All other systems reviewed and are otherwise negative except as noted above.  Physical Exam    Blood pressure 108/71, pulse 66, temperature 98.4 F (36.9 C), temperature source Oral, resp. rate 18, height 6\' 1"  (1.854 m), weight 240 lb (108.9 kg), SpO2 95 %.  General: Pleasant, NAD Psych: Normal affect. Neuro: Alert and oriented X 3. Moves all extremities spontaneously. HEENT: Normal  Neck: Supple without bruits or JVD. Lungs:  Resp regular and unlabored, CTA. Heart: RRR no s3, s4, or murmurs. Abdomen: Soft, non-tender, non-distended, BS + x 4.  Extremities: No clubbing, cyanosis or edema. DP/PT/Radials 2+ and equal bilaterally.  Labs    Troponin [facility] of Care Test)  Recent Labs  10/18/16 1634  TROPIPOC 0.00    Recent Labs  10/18/16 2146 10/19/16 0038 10/19/16 0429  TROPONINI <0.03 <0.03 <0.03   Lab Results  Component Value Date   WBC 9.7 10/18/2016   HGB 15.1 10/18/2016   HCT 43.1 10/18/2016   MCV 91.1 10/18/2016   PLT 245 10/18/2016    Recent Labs Lab 10/18/16 1627  NA 140  K 4.0  CL 108  CO2 24  BUN 17  CREATININE 1.30*  CALCIUM 9.1  GLUCOSE 101*   No results found for: CHOL, HDL, LDLCALC, TRIG No results found for: [facility]   Radiology Studies    Dg Chest 2 View  Result Date: 10/18/2016 CLINICAL DATA:  Intermittent left chest pain EXAM: CHEST  2 VIEW COMPARISON:  None. FINDINGS: The heart size and mediastinal contours are within normal limits. Both lungs are clear. The visualized skeletal structures are unremarkable. IMPRESSION: No acute abnormality noted. Electronically Signed   By: Inez Catalina M.D.   On: 10/18/2016 18:26    ECG & Cardiac Imaging    EKG 10/18/16: NSR  Echocardiogram 10/19/16: pending   Assessment & Plan    1. Chest pain - troponin x 3 negative - EKG without signs of ischemia He has risk factors  for ACS, including family history (brother in his 57s) and history of tobacco use (quit in 1990). He describes typical anginal symptoms that occur with activity and are relieved with rest, radiating to the left chest and arm, band-like pressure, relieved with 20-30 min of rest.  Discussed the case with Dr. Marlou Porch. We will proceed with heart catheterization with possible PCI today. He has no upcoming surgical procedures scheduled and does not have a history of bleeding. He has a history of CKD stage III with sCr of 1.30 on  admission. Repeat BMP this morning is pending and I will start IV fluids. He also has a history of GI upset/vomiting with IVP dye. I ordered 60 mg prednisone and 25 mg benadryl PO pre-cath. The patient is in agreement with this plan.     Signed, Ledora Bottcher, PA-C 10/19/2016, 7:34 AM [phone]  Personally seen and examined. Agree with above.  31 year old with brother CAD, former smoker, with exertional CP with left arm radiation, relieved with rest. Concerning story. Trop, ECG unremarkable. Given pre-test probability, will proceed with cath. Risks and benefits discussed. IVF. Prior nausea with IVP dye. Will give pred PO and benadryl.   Lungs clear, obese, RRR, no rub, no JVD, 2+ right radial, large wrist.   Candee Furbish, MD

## 2016-10-19 NOTE — Discharge Instructions (Addendum)
Radial Site Care Refer to this sheet in the next few weeks. These instructions provide you with information about caring for yourself after your procedure. Your health care provider may also give you more specific instructions. Your treatment has been planned according to current medical practices, but problems sometimes occur. Call your health care provider if you have any problems or questions after your procedure. What can I expect after the procedure? After your procedure, it is typical to have the following:  Bruising at the radial site that usually fades within 1-2 weeks.  Blood collecting in the tissue (hematoma) that may be painful to the touch. It should usually decrease in size and tenderness within 1-2 weeks. Follow these instructions at home:  Take medicines only as directed by your health care provider.  You may shower 24-48 hours after the procedure or as directed by your health care provider. Remove the bandage (dressing) and gently wash the site with plain soap and water. Pat the area dry with a clean towel. Do not rub the site, because this may cause bleeding.  Do not take baths, swim, or use a hot tub until your health care provider approves.  Check your insertion site every day for redness, swelling, or drainage.  Do not apply powder or lotion to the site.  Do not flex or bend the affected arm for 24 hours or as directed by your health care provider.  Do not push or pull heavy objects with the affected arm for 24 hours or as directed by your health care provider.  Do not lift over 10 lb (4.5 kg) for 5 days after your procedure or as directed by your health care provider.  Ask your health care provider when it is okay to:  Return to work or school.  Resume usual physical activities or sports.  Resume sexual activity.  Do not drive home if you are discharged the same day as the procedure. Have someone else drive you.  You may drive 24 hours after the procedure  unless otherwise instructed by your health care provider.  Do not operate machinery or power tools for 24 hours after the procedure.  If your procedure was done as an outpatient procedure, which means that you went home the same day as your procedure, a responsible adult should be with you for the first 24 hours after you arrive home.  Keep all follow-up visits as directed by your health care provider. This is important. Contact a health care provider if:  You have a fever.  You have chills.  You have increased bleeding from the radial site. Hold pressure on the site. Get help right away if:  You have unusual pain at the radial site.  You have redness, warmth, or swelling at the radial site.  You have drainage (other than a small amount of blood on the dressing) from the radial site.  The radial site is bleeding, and the bleeding does not stop after 30 minutes of holding steady pressure on the site.  Your arm or hand becomes pale, cool, tingly, or numb. This information is not intended to replace advice given to you by your health care provider. Make sure you discuss any questions you have with your health care provider. Document Released: 06/19/2010 Document Revised: 10/23/2015 Document Reviewed: 12/03/2013 Elsevier Interactive Patient Education  2017 Elsevier   .No driving for 2 days. No lifting over 5 lbs for 1 week. No sexual activity for 1 week. You may return to work in 2  days. Keep procedure site clean & dry. If you notice increased pain, swelling, bleeding or pus, call/return!  You may shower, but no soaking baths/hot tubs/pools for 1 week.

## 2016-10-19 NOTE — Discharge Summary (Signed)
Discharge Summary  Trevor Potter WUX:[id number] DOB: 1950-09-09  PCP: Briscoe Deutscher, MD  Admit date: 10/18/2016 Discharge date: 10/19/2016  Time spent: <36mins  Recommendations for Outpatient Follow-up:  1. F/u with PMD within a week  for hospital discharge follow up, repeat cbc/bmp at follow up  Discharge Diagnoses:  Active Hospital Problems   Diagnosis Date Noted  . Chest pain 10/18/2016  . CKD (chronic kidney disease), stage II 10/18/2016    Resolved Hospital Problems   Diagnosis Date Noted Date Resolved  No resolved problems to display.    Discharge Condition: stable  Diet recommendation: heart healthy  Filed Weights   10/18/16 2114  Weight: 108.9 kg (240 lb)    History of present illness:  PCP: Briscoe Deutscher, MD   Outpatient Specialists: [facility]  Patient coming from: home Lives   With family   Chief Complaint: Chest pain  HPI: Trevor Potter is a 66 y.o. male with medical history significant of  remote Nissen fundoplication 2536, GERD, tobacco abuse in the past, CKD    Presented with 4 day history of intermittent versus mid chest pain worse with exertion and sometimes associated ringing in his ear overall he has not been feeling well for past few days. No dyspnea, today had some tingling in left arm.  Today he felt something was not right fatigued. Reports good PO intake.  This episodes occurring at rest as well feels a dull versus pressure-like.  When it happened so last few hours but then goes away all by itself.  Pain is mild today but lasted for few hours.  He did had some sweating associated this. She reported that his brother had a heart attack when he was 28s in that worries him.  He himself never had any coronary artery disease that he knows of.  Has not had any cough nausea vomiting denies headache DVT his arms. he has been told in the past that his blood pressure been elevated but he does not take any medicines for it. He has been working in the yard  lately and some times when he carries heavy things he has had some muscle pains in his chest.  Currently chest pain free Reports last stress test was 20 yeas ago and was negative.  IN ER:  Temp (24hrs), Avg:98.4 F (36.9 C), Min:98.4 F (36.9 C), Max:98.4 F (36.9 C)    RR 12 satting 96% HR 69 BP 128/82 NA 140 K4.0 creatinine 1.3 up from 1.2 in 2014 Troponin 0.00 WBC 9.7 hemoglobin 15.1 platelets 245 Chest x-ray nonacute Following Medications were ordered in ER: Medications  aspirin EC tablet 243 mg (243 mg Oral Given 10/18/16 1831)     Hospitalist was called for admission for chest pain evaluation   Hospital Course:  Active Problems:   Chest pain   CKD (chronic kidney disease), stage II   Chest pain - chest pain worsened by exertion on presentation, chest pain resolved at discharge. -cardiac enzymes negative, serial ECG no dynamic st/t changes. hdl 37, ldl 81, a1c pending, tsh wnl.  -Continue asa, start statin,  -cardiology consulted, to have cardiac cath today at [facility] given risk factors .   Mid LAD lesion, 10 %stenosed.  1. Mild non-obstructive CAD 2. Normal filling pressure Recommendations: Medical management of mild CAD with ASA/statin.   He is cleared to discharge home by cardiology post cath.   CKD (chronic kidney disease), stage IIstable currently at baseline  GERD - stable     Code Status:FULL CODE  careas per patient   Family Communication:wife at Bedside   Disposition Plan:To home with cardiology clearance    Consultants:  cardiology  Procedures:  cardiac cath on 5/22  Antibiotics:  none   Discharge Exam: BP 136/86   Pulse 76   Temp 98.3 F (36.8 C) (Oral)   Resp 16   Ht 6\' 1"  (1.854 m)   Wt 108.9 kg (240 lb)   SpO2 98%   BMI 31.66 kg/m   General: NAD Cardiovascular: RRR Respiratory: CTABL  Discharge Instructions You were cared for by a hospitalist during your hospital stay. If you have  any questions about your discharge medications or the care you received while you were in the hospital after you are discharged, you can call the unit and asked to speak with the hospitalist on call if the hospitalist that took care of you is not available. Once you are discharged, your primary care physician will handle any further medical issues. Please note that NO REFILLS for any discharge medications will be authorized once you are discharged, as it is imperative that you return to your primary care physician (or establish a relationship with a primary care physician if you do not have one) for your aftercare needs so that they can reassess your need for medications and monitor your lab values.  Discharge Instructions    Diet - low sodium heart healthy    Complete by:  As directed    Increase activity slowly    Complete by:  As directed      Allergies as of 10/19/2016      Reactions   Ivp Dye [iodinated Diagnostic Agents] Nausea And Vomiting   Aleve [naproxen Sodium] Itching, Rash      Medication List    TAKE these medications   aspirin 81 MG tablet Take 81 mg by mouth daily.   atorvastatin 80 MG tablet Commonly known as:  LIPITOR Take 0.5 tablets (40 mg total) by mouth daily at 6 (six) AM. Start taking on:  10/20/2016   cetirizine 10 MG tablet Commonly known as:  ZYRTEC Take 10 mg by mouth daily.   multivitamin tablet Take 1 tablet by mouth daily as needed.   omeprazole 20 MG capsule Commonly known as:  PRILOSEC Take 1 capsule (20 mg total) by mouth 2 (two) times daily.      Allergies  Allergen Reactions  . Ivp Dye [Iodinated Diagnostic Agents] Nausea And Vomiting  . Aleve [Naproxen Sodium] Itching and Rash   Follow-up Information    Briscoe Deutscher, MD Follow up in 1 week(s).   Specialty:  Family Medicine Why:  hospital discharge follow up. pmd follow up on pending A1c test result Contact information: [address] [place]  [address] [phone]            The results of significant diagnostics from this hospitalization (including imaging, microbiology, ancillary and laboratory) are listed below for reference.    Significant Diagnostic Studies: Dg Chest 2 View  Result Date: 10/18/2016 CLINICAL DATA:  Intermittent left chest pain EXAM: CHEST  2 VIEW COMPARISON:  None. FINDINGS: The heart size and mediastinal contours are within normal limits. Both lungs are clear. The visualized skeletal structures are unremarkable. IMPRESSION: No acute abnormality noted. Electronically Signed   By: Inez Catalina M.D.   On: 10/18/2016 18:26    Microbiology: No results found for this or any previous visit (from the past 240 hour(s)).   Labs: Basic Metabolic Panel:  Recent Labs Lab  10/18/16 1627 10/19/16 0937  NA 140 140  K 4.0 4.0  CL 108 107  CO2 24 25  GLUCOSE 101* 95  BUN 17 17  CREATININE 1.30* 1.24  CALCIUM 9.1 8.8*   Liver Function Tests: No results for input(s): AST, ALT, ALKPHOS, BILITOT, PROT, ALBUMIN in the last 168 hours. No results for input(s): LIPASE, AMYLASE in the last 168 hours. No results for input(s): AMMONIA in the last 168 hours. CBC:  Recent Labs Lab 10/18/16 1627  WBC 9.7  HGB 15.1  HCT 43.1  MCV 91.1  PLT 245   Cardiac Enzymes:  Recent Labs Lab 10/18/16 2146 10/19/16 0038 10/19/16 0429  TROPONINI <0.03 <0.03 <0.03   BNP: BNP (last 3 results) No results for input(s): BNP in the last 8760 hours.  ProBNP (last 3 results) No results for input(s): PROBNP in the last 8760 hours.  CBG: No results for input(s): GLUCAP in the last 168 hours.     SignedFlorencia Reasons MD, PhD  [facility] 10/19/2016, 4:58 PM

## 2016-10-19 NOTE — Interval H&P Note (Signed)
History and Physical Interval Note:  10/19/2016 2:48 PM  Trevor Potter  has presented today for cardiac cath with the diagnosis of unstable angina. The various methods of treatment have been discussed with the patient and family. After consideration of risks, benefits and other options for treatment, the patient has consented to  Procedure(s): Left Heart Cath and Coronary Angiography (N/A) as a surgical intervention .  The patient's history has been reviewed, patient examined, no change in status, stable for surgery.  I have reviewed the patient's chart and labs.  Questions were answered to the patient's satisfaction.    Cath Lab Visit (complete for each Cath Lab visit)  Clinical Evaluation Leading to the Procedure:   ACS: No.  Non-ACS:    Anginal Classification: CCS III  Anti-ischemic medical therapy: No Therapy  Non-Invasive Test Results: No non-invasive testing performed  Prior CABG: No previous CABG         [name]

## 2016-10-19 NOTE — Care Management Note (Signed)
Case Management Note  Patient Details  Name: Trevor Potter MRN: [id number] Date of Birth: 1950-11-20  Subjective/Objective: Transfer to [facility] for cath today. From home.                    Action/Plan:d/c plan home.   Expected Discharge Date:   (unknown)               Expected Discharge Plan:  Home/Self Care  In-House Referral:     Discharge planning Services  CM Consult  Post Acute Care Choice:    Choice offered to:     DME Arranged:    DME Agency:     HH Arranged:    HH Agency:     Status of Service:  In process, will continue to follow  If discussed at Long Length of Stay Meetings, dates discussed:    Additional Comments:  Dessa Phi, RN 10/19/2016, 1:24 PM

## 2016-10-20 ENCOUNTER — Encounter (HOSPITAL_COMMUNITY): Payer: Self-pay | Admitting: Cardiovascular Disease

## 2016-10-20 LAB — HEMOGLOBIN A1C
Hgb A1c MFr Bld: 5.2 % (ref 4.8–5.6)
MEAN PLASMA GLUCOSE: 103 mg/dL

## 2016-10-27 ENCOUNTER — Telehealth: Payer: Self-pay | Admitting: Physician Assistant

## 2016-10-27 NOTE — Telephone Encounter (Signed)
Pt was admitted for chest pain and had a clean cath. Because of concerns for ACS, he was started on a statin. Pt given rx for Lipitor at d/c. He is upset about this and does not wish to take it.   His lipid profile is below. As he does not have CAD and based on lipid profile below, he does not need a statin and does not have hyperlipidemia.   Lipid Panel     Component Value Date/Time   CHOL 141 10/19/2016 0429   TRIG 117 10/19/2016 0429   HDL 37 (L) 10/19/2016 0429   CHOLHDL 3.8 10/19/2016 0429   VLDL 23 10/19/2016 0429   LDLCALC 81 10/19/2016 0429    Ok to f/u with PCP and let that physician manage his cholesterol.  Rosaria Ferries, PA-C 10/27/2016 11:03 AM Beeper [phone]

## 2017-01-20 ENCOUNTER — Encounter (HOSPITAL_BASED_OUTPATIENT_CLINIC_OR_DEPARTMENT_OTHER): Payer: Self-pay

## 2017-01-20 ENCOUNTER — Emergency Department (HOSPITAL_BASED_OUTPATIENT_CLINIC_OR_DEPARTMENT_OTHER)
Admission: EM | Admit: 2017-01-20 | Discharge: 2017-01-20 | Disposition: A | Discharge status: Home / Self Care | Payer: Medicare Other | Attending: Emergency Medicine | Admitting: Emergency Medicine

## 2017-01-20 ENCOUNTER — Emergency Department (HOSPITAL_BASED_OUTPATIENT_CLINIC_OR_DEPARTMENT_OTHER): Payer: Medicare Other

## 2017-01-20 DIAGNOSIS — Z79899 Other long term (current) drug therapy: Secondary | ICD-10-CM | POA: Insufficient documentation

## 2017-01-20 DIAGNOSIS — N200 Calculus of kidney: Secondary | ICD-10-CM | POA: Insufficient documentation

## 2017-01-20 DIAGNOSIS — R1031 Right lower quadrant pain: Secondary | ICD-10-CM | POA: Diagnosis not present

## 2017-01-20 DIAGNOSIS — Z7982 Long term (current) use of aspirin: Secondary | ICD-10-CM | POA: Insufficient documentation

## 2017-01-20 DIAGNOSIS — Z87891 Personal history of nicotine dependence: Secondary | ICD-10-CM | POA: Diagnosis not present

## 2017-01-20 DIAGNOSIS — N182 Chronic kidney disease, stage 2 (mild): Secondary | ICD-10-CM | POA: Diagnosis not present

## 2017-01-20 LAB — CBC WITH DIFFERENTIAL/PLATELET
Basophils Absolute: 0 10*3/uL (ref 0.0–0.1)
Basophils Relative: 0 %
Eosinophils Absolute: 0.1 10*3/uL (ref 0.0–0.7)
Eosinophils Relative: 1 %
HCT: 42.1 % (ref 39.0–52.0)
HEMOGLOBIN: 14.5 g/dL (ref 13.0–17.0)
LYMPHS ABS: 1 10*3/uL (ref 0.7–4.0)
LYMPHS PCT: 9 %
MCH: 31.8 pg (ref 26.0–34.0)
MCHC: 34.4 g/dL (ref 30.0–36.0)
MCV: 92.3 fL (ref 78.0–100.0)
Monocytes Absolute: 0.4 10*3/uL (ref 0.1–1.0)
Monocytes Relative: 4 %
Neutro Abs: 9.8 10*3/uL — ABNORMAL HIGH (ref 1.7–7.7)
Neutrophils Relative %: 86 %
PLATELETS: 235 10*3/uL (ref 150–400)
RBC: 4.56 MIL/uL (ref 4.22–5.81)
RDW: 13 % (ref 11.5–15.5)
WBC: 11.3 10*3/uL — AB (ref 4.0–10.5)

## 2017-01-20 LAB — COMPREHENSIVE METABOLIC PANEL
ALT: 39 U/L (ref 17–63)
AST: 31 U/L (ref 15–41)
Albumin: 4.1 g/dL (ref 3.5–5.0)
Alkaline Phosphatase: 80 U/L (ref 38–126)
Anion gap: 7 (ref 5–15)
BILIRUBIN TOTAL: 1.3 mg/dL — AB (ref 0.3–1.2)
BUN: 20 mg/dL (ref 6–20)
CHLORIDE: 108 mmol/L (ref 101–111)
CO2: 26 mmol/L (ref 22–32)
CREATININE: 1.38 mg/dL — AB (ref 0.61–1.24)
Calcium: 9.1 mg/dL (ref 8.9–10.3)
GFR, EST NON AFRICAN AMERICAN: 52 mL/min — AB (ref >60)
Glucose, Bld: 138 mg/dL — ABNORMAL HIGH (ref 65–99)
Potassium: 4 mmol/L (ref 3.5–5.1)
Sodium: 141 mmol/L (ref 135–145)
TOTAL PROTEIN: 6.4 g/dL — AB (ref 6.5–8.1)

## 2017-01-20 LAB — LIPASE, BLOOD: LIPASE: 39 U/L (ref 11–51)

## 2017-01-20 MED ORDER — SODIUM CHLORIDE 0.9 % IV BOLUS (SEPSIS)
1000.0000 mL | Freq: Once | INTRAVENOUS | Status: AC
  Administered 2017-01-20: 1000 mL via INTRAVENOUS

## 2017-01-20 MED ORDER — ONDANSETRON 4 MG PO TBDP: ORAL_TABLET | ORAL | 0 refills | Status: DC

## 2017-01-20 MED ORDER — MORPHINE SULFATE (PF) 4 MG/ML IV SOLN
4.0000 mg | Freq: Once | INTRAVENOUS | Status: AC
  Administered 2017-01-20: 4 mg via INTRAVENOUS
  Filled 2017-01-20: qty 1

## 2017-01-20 MED ORDER — TAMSULOSIN HCL 0.4 MG PO CAPS: 0.4000 mg | ORAL_CAPSULE | Freq: Every day | ORAL | 0 refills | Status: DC

## 2017-01-20 MED ORDER — MORPHINE SULFATE 15 MG PO TABS: 15.0000 mg | ORAL_TABLET | ORAL | 0 refills | Status: DC | PRN

## 2017-01-20 MED ORDER — ONDANSETRON HCL 4 MG/2ML IJ SOLN
4.0000 mg | Freq: Once | INTRAMUSCULAR | Status: AC
  Administered 2017-01-20: 4 mg via INTRAVENOUS
  Filled 2017-01-20: qty 2

## 2017-01-20 MED FILL — TAMSULOSIN HCL 0.4 MG CAP: 0.4 | 30 days supply | Qty: 30 | Fill #0

## 2017-01-20 MED FILL — MORPHINE SULFATE IR 15 MG T: 15 | 1 days supply | Qty: 5 | Fill #0

## 2017-01-20 MED FILL — ONDANSETRON ODT 4 MG TABLET: 4 | 4 days supply | Qty: 20 | Fill #0

## 2017-01-20 NOTE — ED Provider Notes (Signed)
[facility] DEPT [facility] Provider Note   CSN: [id number] Arrival date & time: 01/20/17  1004     History   Chief Complaint Chief Complaint  Patient presents with  . Abdominal Pain    HPI Trevor Potter is a 66 y.o. male.  66 yo M with a cc of RLQ abdominal pain. This started about 2 hours ago. Described as sharp and stabbing. Constant and unrelenting. Denies radiation. Denies pain to the penis or testicles. Has had nausea but denies vomiting. Has had 2 loose stools that are non-bloody or dark. Denies fevers or chills. History of a fundoplication, denies other abdominal surgery.   The history is provided by the patient and the spouse.  Abdominal Pain   This is a new problem. The current episode started 2 days ago. The problem occurs constantly. The problem has not changed since onset.The pain is associated with an unknown factor. The pain is located in the RLQ. The quality of the pain is sharp and shooting. The pain is at a severity of 9/10. The pain is moderate. Associated symptoms include diarrhea (loose stool x2) and nausea. Pertinent negatives include fever, vomiting, headaches, arthralgias and myalgias. Nothing aggravates the symptoms. Nothing relieves the symptoms.    Past Medical History:  Diagnosis Date  . Chronic kidney disease   . Colon polyp 03/12/2011   Sigmoid polyp  . GERD (gastroesophageal reflux disease)   . Ulcer     Patient Active Problem List   Diagnosis Date Noted  . Chest pain 10/18/2016  . Elevated blood pressure reading 10/18/2016  . CKD (chronic kidney disease), stage II 10/18/2016  . S/P Nissen fundoplication (without gastrostomy tube) procedure 07/18/2012  . Diverticulosis of colon without hemorrhage 07/18/2012    Past Surgical History:  Procedure Laterality Date  . CERVICAL FUSION     with plates and screws  . COLONOSCOPY    . LEFT HEART CATH AND CORONARY ANGIOGRAPHY N/A 10/19/2016   Procedure: Left Heart Cath and Coronary Angiography;   Surgeon: Burnell Blanks, MD;  Location: [facility];  Service: Cardiovascular;  Laterality: N/A;  . NISSEN FUNDOPLICATION    . UPPER GASTROINTESTINAL ENDOSCOPY         Home Medications    Prior to Admission medications   Medication Sig Start Date End Date Taking? Authorizing Provider  aspirin 81 MG tablet Take 81 mg by mouth daily.      [provider]  cetirizine (ZYRTEC) 10 MG tablet Take 10 mg by mouth daily.    [provider]  morphine (MSIR) 15 MG tablet Take 1 tablet (15 mg total) by mouth every 4 (four) hours as needed for severe pain. 01/20/17   Deno Etienne, DO  Multiple Vitamin (MULTIVITAMIN) tablet Take 1 tablet by mouth daily as needed.    [provider]  omeprazole (PRILOSEC) 20 MG capsule Take 1 capsule (20 mg total) by mouth 2 (two) times daily. 07/18/12 07/18/13  Esterwood, Amy S, PA-C  ondansetron (ZOFRAN ODT) 4 MG disintegrating tablet 4mg  ODT q4 hours prn nausea/vomit 01/20/17   Deno Etienne, DO  tamsulosin (FLOMAX) 0.4 MG CAPS capsule Take 1 capsule (0.4 mg total) by mouth daily after supper. 01/20/17   Deno Etienne, DO    Family History Family History  Problem Relation Age of Onset  . Clotting disorder Mother   . Cirrhosis Father   . Heart disease Sister   . Heart disease Brother   . Colon cancer Neg Hx   . Colon polyps  Neg Hx     Social History Social History  Substance Use Topics  . Smoking status: Former Smoker    Types: Cigarettes    Quit date: 03/01/1989  . Smokeless tobacco: Never Used  . Alcohol use 0.0 oz/week     Comment: 1-2 drinks daily     Allergies   Ivp dye [iodinated diagnostic agents] and Aleve [naproxen sodium]   Review of Systems Review of Systems  Constitutional: Negative for chills and fever.  HENT: Negative for congestion and facial swelling.   Eyes: Negative for discharge and visual disturbance.  Respiratory: Negative for shortness of breath.   Cardiovascular: Negative for chest pain and  palpitations.  Gastrointestinal: Positive for abdominal pain, diarrhea (loose stool x2) and nausea. Negative for vomiting.  Musculoskeletal: Negative for arthralgias and myalgias.  Skin: Negative for color change and rash.  Neurological: Negative for tremors, syncope and headaches.  Psychiatric/Behavioral: Negative for confusion and dysphoric mood.     Physical Exam Updated Vital Signs BP (!) 154/81 (BP Location: Right Arm)   Pulse (!) 52   Temp 98 F (36.7 C) (Oral)   Resp 18   Ht 6\' 1"  (1.854 m)   Wt 105.2 kg (232 lb)   SpO2 100%   BMI 30.61 kg/m   Physical Exam  Constitutional: He is oriented to person, place, and time. He appears well-developed and well-nourished.  HENT:  Head: Normocephalic and atraumatic.  Eyes: Pupils are equal, round, and reactive to light. EOM are normal.  Neck: Normal range of motion. Neck supple. No JVD present.  Cardiovascular: Normal rate and regular rhythm.  Exam reveals no gallop and no friction rub.   No murmur heard. Pulmonary/Chest: No respiratory distress. He has no wheezes.  Abdominal: He exhibits no distension and no mass. There is tenderness (worst to the RLQ). There is guarding. There is no rebound.  Musculoskeletal: Normal range of motion.  Neurological: He is alert and oriented to person, place, and time.  Skin: No rash noted. No pallor.  Psychiatric: He has a normal mood and affect. His behavior is normal.  Nursing note and vitals reviewed.    ED Treatments / Results  Labs (all labs ordered are listed, but only abnormal results are displayed) Labs Reviewed  CBC WITH DIFFERENTIAL/PLATELET - Abnormal; Notable for the following:       Result Value   WBC 11.3 (*)    Neutro Abs 9.8 (*)    All other components within normal limits  COMPREHENSIVE METABOLIC PANEL - Abnormal; Notable for the following:    Glucose, Bld 138 (*)    Creatinine, Ser 1.38 (*)    Total Protein 6.4 (*)    Total Bilirubin 1.3 (*)    GFR calc non Af Amer  52 (*)    All other components within normal limits  LIPASE, BLOOD  URINALYSIS, ROUTINE W REFLEX MICROSCOPIC    EKG  EKG Interpretation None       Radiology Ct Abdomen Pelvis Wo Contrast  Result Date: 01/20/2017 CLINICAL DATA:  Right lower quadrant pain, nausea EXAM: CT ABDOMEN AND PELVIS WITHOUT CONTRAST TECHNIQUE: Multidetector CT imaging of the abdomen and pelvis was performed following the standard protocol without IV contrast. COMPARISON:  None. FINDINGS: Lower chest: Minor dependent basilar atelectasis. Normal heart size. No pericardial or pleural effusion. Patient is status post Nissen fundoplication repair. No recurrent hiatal hernia. Hepatobiliary: No focal liver abnormality is seen. No gallstones, gallbladder wall thickening, or biliary dilatation. Pancreas: Unremarkable. No pancreatic ductal dilatation or  surrounding inflammatory changes. Spleen: Normal in size without focal abnormality. Adrenals/Urinary Tract: Normal adrenal glands. Both kidneys demonstrate chronic perinephric strandy edema and punctate nonobstructing intrarenal calculi. Parapelvic cysts present bilaterally. Right kidney demonstrates mild hydronephrosis and associated right hydroureter extending into the pelvis. No obstructing right ureteral calculus however there is a dependent calculus in the bladder measuring 4 mm compatible with a recently passed stone, suspected from the right urinary tract. No other bladder abnormality. Left ureter is normal in appearance. Stomach/Bowel: Negative for bowel obstruction, significant dilatation, ileus, free air. No fluid collection or abscess. Normal appendix. Colonic diverticulosis evident. No acute inflammatory process. Vascular/Lymphatic: Minor abdominal atherosclerosis. Negative for aneurysm. No adenopathy. Reproductive: Mild prostate enlargement. Other: No abdominal wall hernia or abnormality. No abdominopelvic ascites. Musculoskeletal: Bones are osteopenic. 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 No acute compression fracture IMPRESSION: 4 mm dependent bladder calculus, suspect recent stone passage from the right urinary tract system with residual right mild hydroureteronephrosis. Additional punctate nonobstructing intrarenal calculi bilaterally. Negative for appendicitis Electronically Signed   By: Jerilynn Mages.  Shick M.D.   On: 01/20/2017 11:25    Procedures Procedures (including critical care time)  Medications Ordered in ED Medications  sodium chloride 0.9 % bolus 1,000 mL (0 mLs Intravenous Stopped 01/20/17 1112)  ondansetron (ZOFRAN) injection 4 mg (4 mg Intravenous Given 01/20/17 1036)  morphine 4 MG/ML injection 4 mg (4 mg Intravenous Given 01/20/17 1036)     Initial Impression / Assessment and Plan / ED Course  I have reviewed the triage vital signs and the nursing notes.  Pertinent labs & imaging results that were available during my care of the patient were reviewed by me and considered in my medical decision making (see chart for details).     66 yo M With right lower quadrant abdominal pain. Pain is worse at McBurney's point. He has a contrast dye allergy will obtain a CT without contrast.  CT with right-sided hydro, stone is likely in the bladder. Patient has had some significant improvement of his pain since arriving. He discarded his urine into the toilet by accident. At this point I discussed the limited utility if the stone is actually in the bladder would be unlikely that any further intervention would occur if this was infected. Patient has not been having urinary symptoms fevers or chills at this point will defer urinalysis.  11:41 AM:  I have discussed the diagnosis/risks/treatment options with the patient and family and believe the pt to be eligible for discharge home to follow-up with Urology. We also discussed returning to the ED immediately if new or  worsening sx occur. We discussed the sx which are most concerning (e.g., sudden worsening pain, fever, inability to tolerate by mouth) that necessitate immediate return. Medications administered to the patient during their visit and any new prescriptions provided to the patient are listed below.  Medications given during this visit Medications  sodium chloride 0.9 % bolus 1,000 mL (0 mLs Intravenous Stopped 01/20/17 1112)  ondansetron (ZOFRAN) injection 4 mg (4 mg Intravenous Given 01/20/17 1036)  morphine 4 MG/ML injection 4 mg (4 mg Intravenous Given 01/20/17 1036)     The patient appears reasonably screen and/or stabilized for discharge and I doubt any other medical condition or other [facility] requiring further screening, evaluation, or treatment in the ED at this time prior to discharge.    Final Clinical Impressions(s) / ED Diagnoses  Final diagnoses:  Nephrolithiasis    New Prescriptions New Prescriptions   MORPHINE (MSIR) 15 MG TABLET    Take 1 tablet (15 mg total) by mouth every 4 (four) hours as needed for severe pain.   ONDANSETRON (ZOFRAN ODT) 4 MG DISINTEGRATING TABLET    4mg  ODT q4 hours prn nausea/vomit   TAMSULOSIN (FLOMAX) 0.4 MG CAPS CAPSULE    Take 1 capsule (0.4 mg total) by mouth daily after supper.     Deno Etienne, DO 01/20/17 1141

## 2017-01-20 NOTE — Discharge Instructions (Signed)
Take 4 over the counter ibuprofen tablets 3 times a day or 2 over-the-counter naproxen tablets twice a day for pain. Also take tylenol 1000mg(2 extra strength) four times a day.    

## 2017-01-20 NOTE — ED Triage Notes (Signed)
RLQ pain starting 2 hours ago. Nausea with no vomiting. Some diarrhea.

## 2017-01-20 NOTE — ED Notes (Signed)
Pt attempted to obtain urine specimen, pt dropped specimen into toilet. MD made aware.

## 2017-01-20 NOTE — ED Notes (Signed)
Pt ambulated to rest room, attempting to obtain urine specimen.

## 2017-01-20 NOTE — ED Notes (Signed)
Pt returned from CT °

## 2017-03-31 DIAGNOSIS — Z23 Encounter for immunization: Secondary | ICD-10-CM | POA: Diagnosis not present

## 2017-09-06 DIAGNOSIS — N200 Calculus of kidney: Secondary | ICD-10-CM | POA: Diagnosis not present

## 2017-09-06 DIAGNOSIS — R1031 Right lower quadrant pain: Secondary | ICD-10-CM | POA: Diagnosis not present

## 2017-11-09 IMAGING — CR DG CHEST 2V
2 series · 2 of 2 positions shown · non-contrast
Comparison: None.

CLINICAL DATA: Intermittent left chest pain

EXAM:
CHEST  2 VIEW

[w chest pa]
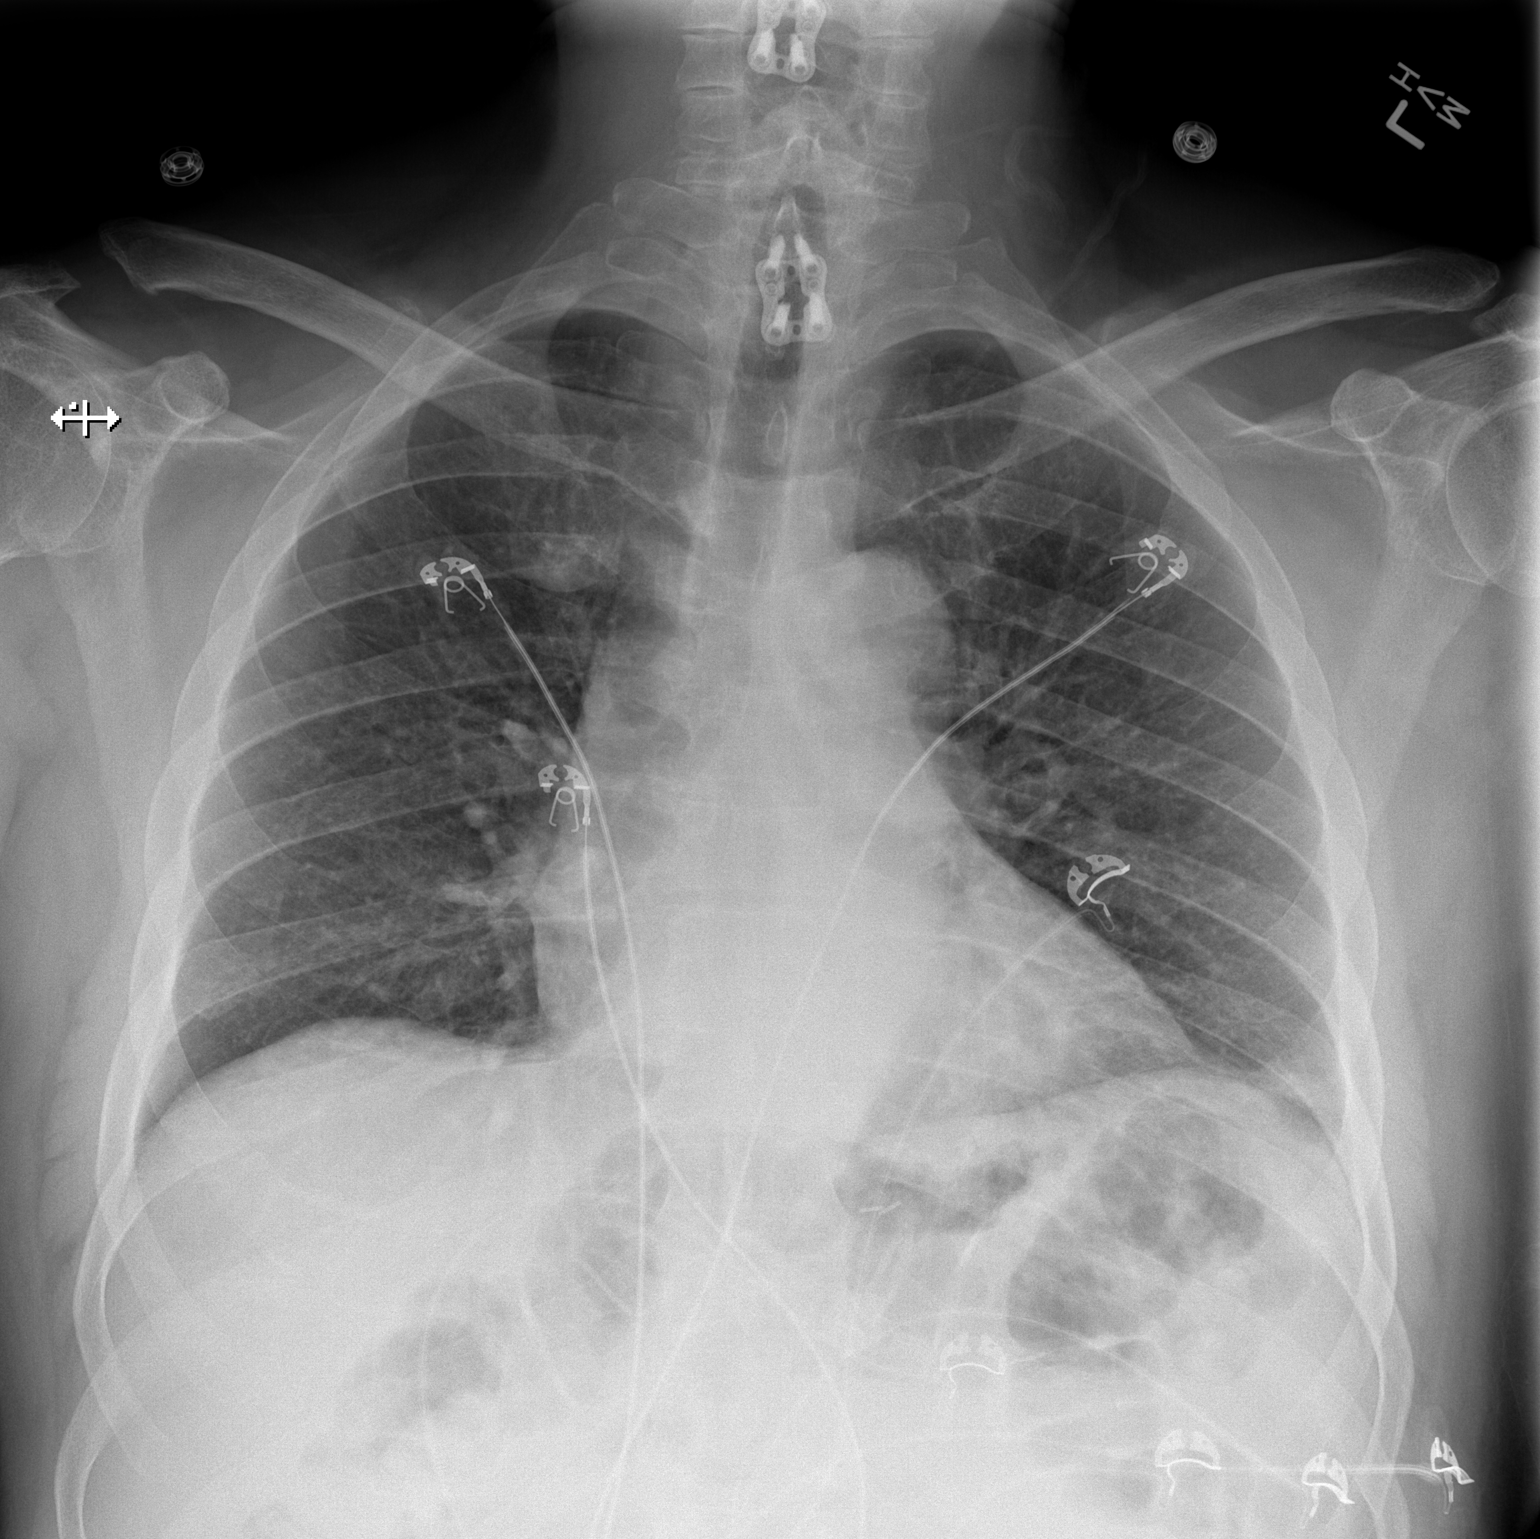

[w chest lat]
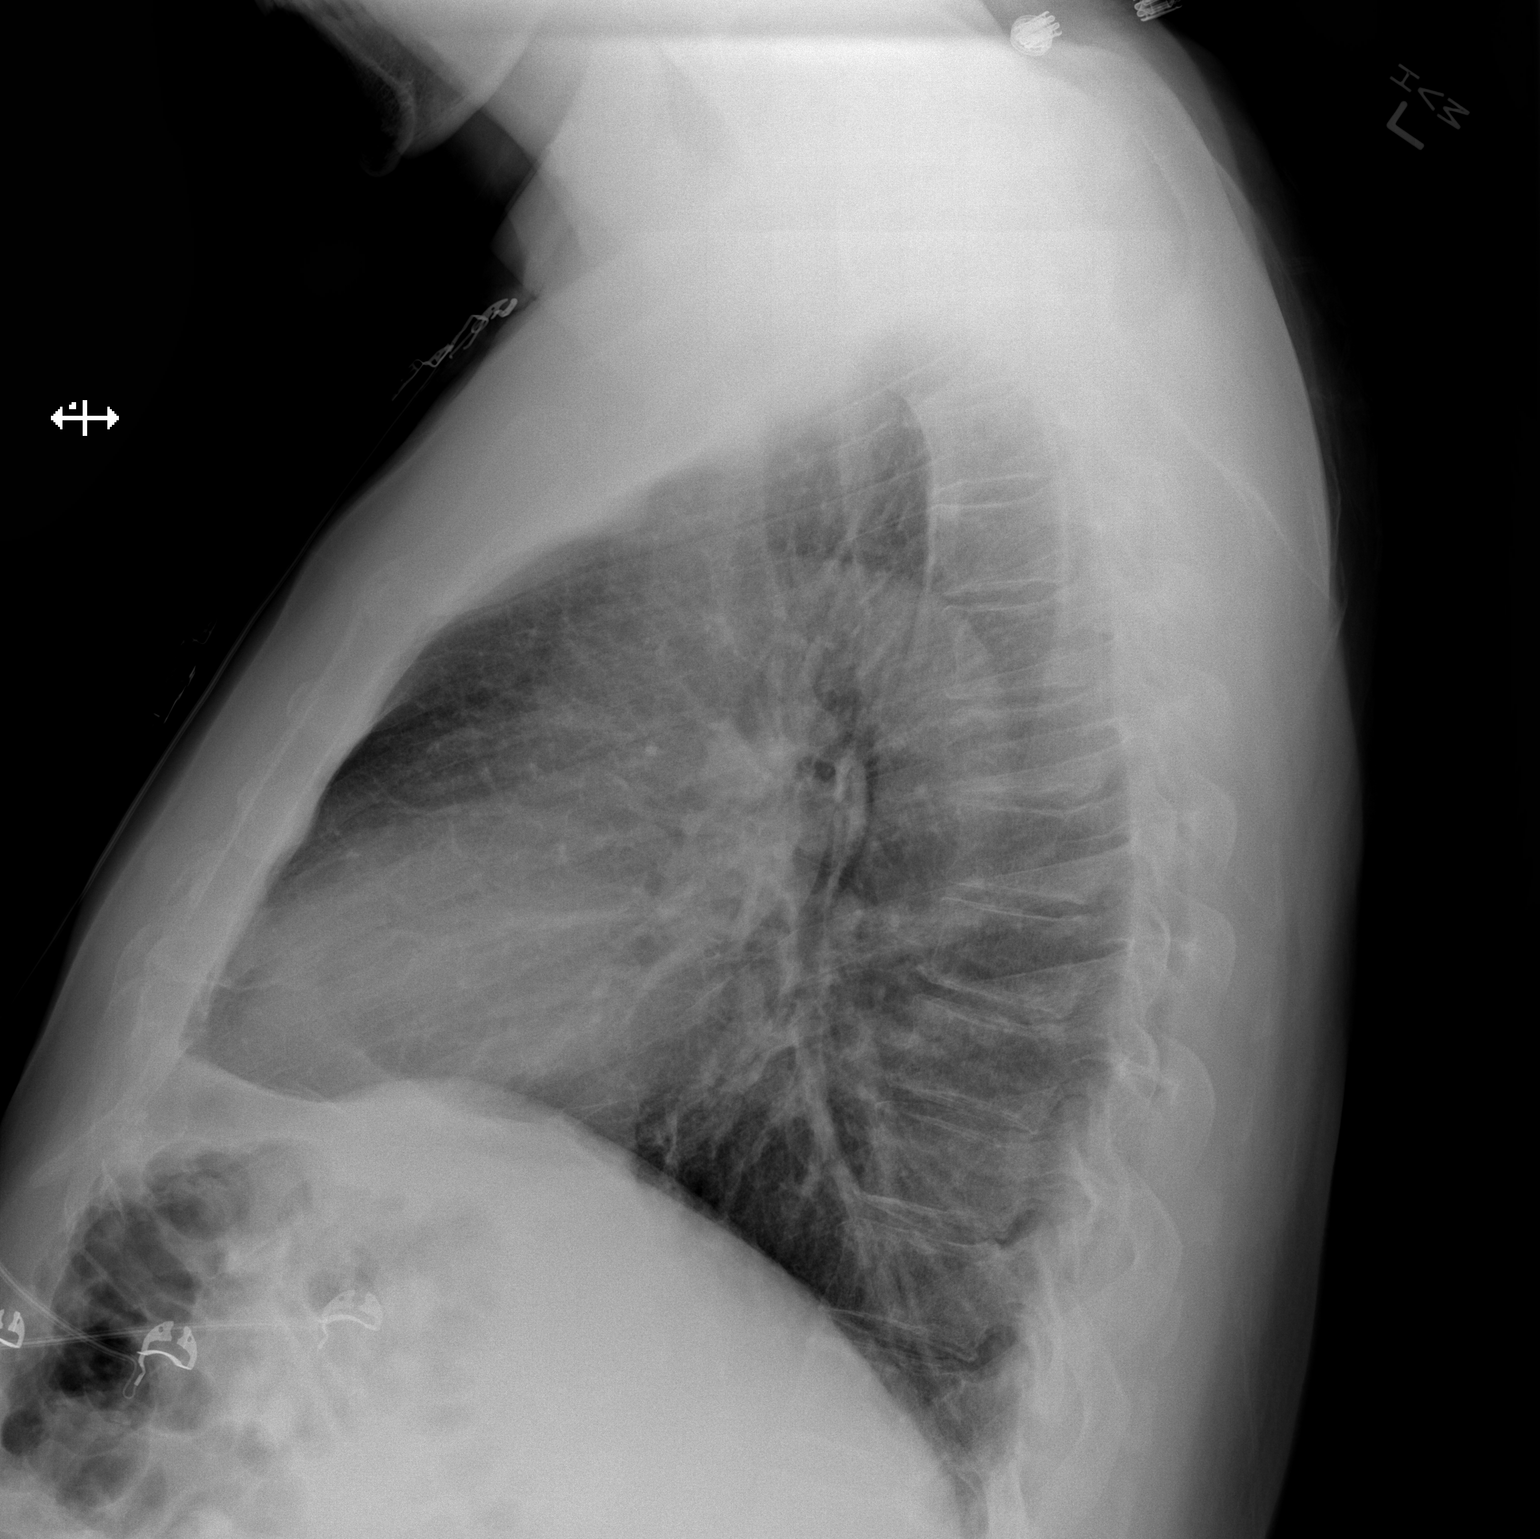

[2 of 2 positions shown; findings below may reference images not displayed]

FINDINGS: The heart size and mediastinal contours are within normal limits.
Both lungs are clear. The visualized skeletal structures are
unremarkable.
IMPRESSION: No acute abnormality noted.

## 2018-04-05 DIAGNOSIS — Z23 Encounter for immunization: Secondary | ICD-10-CM | POA: Diagnosis not present

## 2018-07-10 DIAGNOSIS — M9903 Segmental and somatic dysfunction of lumbar region: Secondary | ICD-10-CM | POA: Diagnosis not present

## 2018-07-10 DIAGNOSIS — M5432 Sciatica, left side: Secondary | ICD-10-CM | POA: Diagnosis not present

## 2018-07-11 DIAGNOSIS — M5432 Sciatica, left side: Secondary | ICD-10-CM | POA: Diagnosis not present

## 2018-07-11 DIAGNOSIS — M9903 Segmental and somatic dysfunction of lumbar region: Secondary | ICD-10-CM | POA: Diagnosis not present

## 2018-07-13 DIAGNOSIS — M5432 Sciatica, left side: Secondary | ICD-10-CM | POA: Diagnosis not present

## 2018-07-13 DIAGNOSIS — M9903 Segmental and somatic dysfunction of lumbar region: Secondary | ICD-10-CM | POA: Diagnosis not present

## 2018-07-18 DIAGNOSIS — M5432 Sciatica, left side: Secondary | ICD-10-CM | POA: Diagnosis not present

## 2018-07-18 DIAGNOSIS — M9903 Segmental and somatic dysfunction of lumbar region: Secondary | ICD-10-CM | POA: Diagnosis not present

## 2018-07-20 DIAGNOSIS — M5432 Sciatica, left side: Secondary | ICD-10-CM | POA: Diagnosis not present

## 2018-07-20 DIAGNOSIS — M9903 Segmental and somatic dysfunction of lumbar region: Secondary | ICD-10-CM | POA: Diagnosis not present

## 2018-07-25 DIAGNOSIS — M5432 Sciatica, left side: Secondary | ICD-10-CM | POA: Diagnosis not present

## 2018-07-25 DIAGNOSIS — M9903 Segmental and somatic dysfunction of lumbar region: Secondary | ICD-10-CM | POA: Diagnosis not present

## 2018-07-27 DIAGNOSIS — M9903 Segmental and somatic dysfunction of lumbar region: Secondary | ICD-10-CM | POA: Diagnosis not present

## 2018-07-27 DIAGNOSIS — M5432 Sciatica, left side: Secondary | ICD-10-CM | POA: Diagnosis not present

## 2018-08-03 DIAGNOSIS — M5432 Sciatica, left side: Secondary | ICD-10-CM | POA: Diagnosis not present

## 2018-08-03 DIAGNOSIS — M9903 Segmental and somatic dysfunction of lumbar region: Secondary | ICD-10-CM | POA: Diagnosis not present

## 2018-08-10 DIAGNOSIS — M9903 Segmental and somatic dysfunction of lumbar region: Secondary | ICD-10-CM | POA: Diagnosis not present

## 2018-08-10 DIAGNOSIS — M5432 Sciatica, left side: Secondary | ICD-10-CM | POA: Diagnosis not present

## 2019-06-23 ENCOUNTER — Ambulatory Visit: Payer: Medicare Other | Attending: Internal Medicine

## 2019-06-23 DIAGNOSIS — Z23 Encounter for immunization: Secondary | ICD-10-CM | POA: Insufficient documentation

## 2019-06-23 NOTE — Progress Notes (Signed)
   Covid-19 Vaccination Clinic  Name:  Izacc Dorsett    MRN: [id number] DOB: 1950/06/03  06/23/2019  Mr. Pole was observed post Covid-19 immunization for 15 minutes without incidence. He was provided with Vaccine Information Sheet and instruction to access the V-Safe system.   Mr. Toelle was instructed to call 911 with any severe reactions post vaccine: Marland Kitchen Difficulty breathing  . Swelling of your face and throat  . A fast heartbeat  . A bad rash all over your body  . Dizziness and weakness    Immunizations Administered    Name Date Dose VIS Date Route   Pfizer COVID-19 Vaccine 06/23/2019  3:28 PM 0.3 mL 05/11/2019 Intramuscular   Manufacturer: [place]   Lot: [id number]   [place]: [id number]

## 2019-07-16 ENCOUNTER — Ambulatory Visit: Payer: Medicare Other | Attending: Internal Medicine

## 2019-07-16 DIAGNOSIS — Z23 Encounter for immunization: Secondary | ICD-10-CM

## 2019-07-16 NOTE — Progress Notes (Signed)
   Covid-19 Vaccination Clinic  Name:  Trevor Potter    MRN: [id number] DOB: [birth year]  07/16/2019  [name] was observed post Covid-19 immunization for 15 minutes without incidence. He was provided with Vaccine Information Sheet and instruction to access the V-Safe system.   [name] was instructed to call 911 with any severe reactions post vaccine: Marland Kitchen Difficulty breathing  . Swelling of your face and throat  . A fast heartbeat  . A bad rash all over your body  . Dizziness and weakness    Immunizations Administered    Name Date Dose VIS Date Route   Pfizer COVID-19 Vaccine 07/16/2019 11:17 AM 0.3 mL 05/11/2019 Intramuscular   Manufacturer: [place]   Lot: [id number]   [place]: [id number]

## 2019-11-14 DIAGNOSIS — Z0120 Encounter for dental examination and cleaning without abnormal findings: Secondary | ICD-10-CM | POA: Diagnosis not present

## 2019-11-17 DIAGNOSIS — Z0120 Encounter for dental examination and cleaning without abnormal findings: Secondary | ICD-10-CM | POA: Diagnosis not present

## 2020-01-21 ENCOUNTER — Encounter: Payer: Self-pay | Admitting: Family Medicine

## 2020-01-21 ENCOUNTER — Ambulatory Visit (INDEPENDENT_AMBULATORY_CARE_PROVIDER_SITE_OTHER): Payer: Medicare Other | Admitting: Family Medicine

## 2020-01-21 ENCOUNTER — Other Ambulatory Visit: Payer: Self-pay

## 2020-01-21 VITALS — BP 133/88 | HR 64 | Temp 98.3°F | Ht 73.0 in | Wt 231.0 lb

## 2020-01-21 DIAGNOSIS — R739 Hyperglycemia, unspecified: Secondary | ICD-10-CM

## 2020-01-21 DIAGNOSIS — E669 Obesity, unspecified: Secondary | ICD-10-CM

## 2020-01-21 DIAGNOSIS — Z1159 Encounter for screening for other viral diseases: Secondary | ICD-10-CM

## 2020-01-21 DIAGNOSIS — Z0001 Encounter for general adult medical examination with abnormal findings: Secondary | ICD-10-CM

## 2020-01-21 DIAGNOSIS — Z13220 Encounter for screening for lipoid disorders: Secondary | ICD-10-CM

## 2020-01-21 DIAGNOSIS — Z87442 Personal history of urinary calculi: Secondary | ICD-10-CM | POA: Diagnosis not present

## 2020-01-21 DIAGNOSIS — J309 Allergic rhinitis, unspecified: Secondary | ICD-10-CM | POA: Diagnosis not present

## 2020-01-21 DIAGNOSIS — Z6830 Body mass index (BMI) 30.0-30.9, adult: Secondary | ICD-10-CM

## 2020-01-21 NOTE — Patient Instructions (Signed)
It was very nice to see you today!  We will check blood work today.  Please continue working on diet and exercise.  I will see you back in year.  Please come back see me sooner if needed.  Take care, Dr [name]  Please try these tips to maintain a healthy lifestyle:   Eat at least 3 REAL meals and 1-2 snacks per day.  Aim for no more than 5 hours between eating.  If you eat breakfast, please do so within one hour of getting up.    Each meal should contain half fruits/vegetables, one quarter protein, and one quarter carbs (no bigger than a computer mouse)   Cut down on sweet beverages. This includes juice, soda, and sweet tea.     Drink at least 1 glass of water with each meal and aim for at least 8 glasses per day   Exercise at least 150 minutes every week.    Preventive Care 78 Years and Older, Male Preventive care refers to lifestyle choices and visits with your health care provider that can promote health and wellness. This includes:  A yearly physical exam. This is also called an annual well check.  Regular dental and eye exams.  Immunizations.  Screening for certain conditions.  Healthy lifestyle choices, such as diet and exercise. What can I expect for my preventive care visit? Physical exam Your health care provider will check:  Height and weight. These may be used to calculate body mass index (BMI), which is a measurement that tells if you are at a healthy weight.  Heart rate and blood pressure.  Your skin for abnormal spots. Counseling Your health care provider may ask you questions about:  Alcohol, tobacco, and drug use.  Emotional well-being.  Home and relationship well-being.  Sexual activity.  Eating habits.  History of falls.  Memory and ability to understand (cognition).  Work and work Statistician. What immunizations do I need?  Influenza (flu) vaccine  This is recommended every year. Tetanus, diphtheria, and pertussis (Tdap)  vaccine  You may need a Td booster every 10 years. Varicella (chickenpox) vaccine  You may need this vaccine if you have not already been vaccinated. Zoster (shingles) vaccine  You may need this after age 66. Pneumococcal conjugate (PCV13) vaccine  One dose is recommended after age 88. Pneumococcal polysaccharide (PPSV23) vaccine  One dose is recommended after age 52. Measles, mumps, and rubella (MMR) vaccine  You may need at least one dose of MMR if you were born in 1957 or later. You may also need a second dose. Meningococcal conjugate (MenACWY) vaccine  You may need this if you have certain conditions. Hepatitis A vaccine  You may need this if you have certain conditions or if you travel or work in places where you may be exposed to hepatitis A. Hepatitis B vaccine  You may need this if you have certain conditions or if you travel or work in places where you may be exposed to hepatitis B. Haemophilus influenzae type b (Hib) vaccine  You may need this if you have certain conditions. You may receive vaccines as individual doses or as more than one vaccine together in one shot (combination vaccines). Talk with your health care provider about the risks and benefits of combination vaccines. What tests do I need? Blood tests  Lipid and cholesterol levels. These may be checked every 5 years, or more frequently depending on your overall health.  Hepatitis C test.  Hepatitis B test. Screening  Lung cancer screening. You may have this screening every year starting at age 93 if you have a 30-pack-year history of smoking and currently smoke or have quit within the past 15 years.  Colorectal cancer screening. All adults should have this screening starting at age 16 and continuing until age 86. Your health care provider may recommend screening at age 52 if you are at increased risk. You will have tests every 1-10 years, depending on your results and the type of screening  test.  Prostate cancer screening. Recommendations will vary depending on your family history and other risks.  Diabetes screening. This is done by checking your blood sugar (glucose) after you have not eaten for a while (fasting). You may have this done every 1-3 years.  Abdominal aortic aneurysm (AAA) screening. You may need this if you are a current or former smoker.  Sexually transmitted disease (STD) testing. Follow these instructions at home: Eating and drinking  Eat a diet that includes fresh fruits and vegetables, whole grains, lean protein, and low-fat dairy products. Limit your intake of foods with high amounts of sugar, saturated fats, and salt.  Take vitamin and mineral supplements as recommended by your health care provider.  Do not drink alcohol if your health care provider tells you not to drink.  If you drink alcohol: ? Limit how much you have to 0-2 drinks a day. ? Be aware of how much alcohol is in your drink. In the [place] one drink equals one 12 oz bottle of beer (355 mL), one 5 oz glass of wine (148 mL), or one 1 oz glass of hard liquor (44 mL). Lifestyle  Take daily care of your teeth and gums.  Stay active. Exercise for at least 30 minutes on 5 or more days each week.  Do not use any products that contain nicotine or tobacco, such as cigarettes, e-cigarettes, and chewing tobacco. If you need help quitting, ask your health care provider.  If you are sexually active, practice safe sex. Use a condom or other form of protection to prevent STIs (sexually transmitted infections).  Talk with your health care provider about taking a low-dose aspirin or statin. What's next?  Visit your health care provider once a year for a well check visit.  Ask your health care provider how often you should have your eyes and teeth checked.  Stay up to date on all vaccines. This information is not intended to replace advice given to you by your health care provider. Make sure you  discuss any questions you have with your health care provider. Document Revised: 05/11/2018 Document Reviewed: 05/11/2018 Elsevier Patient Education  2020 [employer].

## 2020-01-21 NOTE — Progress Notes (Signed)
Chief Complaint:  Trevor Potter is a 69 y.o. male who presents today for his annual comprehensive physical exam.  He is a new patient.   Assessment/Plan:  Chronic Problems Addressed Today: Allergic rhinitis Stable. Continue zyrtec 10mg  daily.   History of kidney stones Stable.  Continue management per urology.  Hyperglycemia Check A1c.   Body mass index is 30.48 kg/m. / Obese  BMI Metric Follow Up - 01/21/20 1126      BMI Metric Follow Up-Please document annually   BMI Metric Follow Up Education provided           Preventative Healthcare: Last prostate exam tomorrow.  Due for colonoscopy next year.  Check CBC, CMET, TSH, lipid panel.  Patient Counseling(The following topics were reviewed and/or handout was given):  -Nutrition: Stressed importance of moderation in sodium/caffeine intake, saturated fat and cholesterol, caloric balance, sufficient intake of fresh fruits, vegetables, and fiber.  -Stressed the importance of regular exercise.   -Substance Abuse: Discussed cessation/primary prevention of tobacco, alcohol, or other drug use; driving or other dangerous activities under the influence; availability of treatment for abuse.   -Injury prevention: Discussed safety belts, safety helmets, smoke detector, smoking near bedding or upholstery.   -Sexuality: Discussed sexually transmitted diseases, partner selection, use of condoms, avoidance of unintended pregnancy and contraceptive alternatives.   -Dental health: Discussed importance of regular tooth brushing, flossing, and dental visits.  -Health maintenance and immunizations reviewed. Please refer to Health maintenance section.  Return to care in 1 year for next preventative visit.     Subjective:  HPI:  He has no acute complaints today.   Lifestyle Diet: Balanced. Plenty of fruits and vegetables.  Exercise: Limited. Likes to golf. Does a lot of yard work.   No flowsheet data found.  Health Maintenance Due  Topic  Date Due  . Hepatitis C Screening  Never done  . TETANUS/TDAP  Never done  . PNA vac Low Risk Adult (1 of 2 - PCV13) Never done    ROS: Per HPI, otherwise a complete review of systems was negative.   PMH:  The following were reviewed and entered/updated in epic: Past Medical History:  Diagnosis Date  . Chronic kidney disease   . Colon polyp 03/12/2011   Sigmoid polyp  . GERD (gastroesophageal reflux disease)   . Ulcer    Patient Active Problem List   Diagnosis Date Noted  . Hyperglycemia 01/21/2020  . History of kidney stones 01/21/2020  . Allergic rhinitis 01/21/2020  . S/P Nissen fundoplication (without gastrostomy tube) procedure 07/18/2012  . Diverticulosis of colon without hemorrhage 07/18/2012   Past Surgical History:  Procedure Laterality Date  . CERVICAL FUSION     with plates and screws  . COLONOSCOPY    . LEFT HEART CATH AND CORONARY ANGIOGRAPHY N/A 10/19/2016   Procedure: Left Heart Cath and Coronary Angiography;  Surgeon: Burnell Blanks, MD;  Location: [facility];  Service: Cardiovascular;  Laterality: N/A;  . NISSEN FUNDOPLICATION    . UPPER GASTROINTESTINAL ENDOSCOPY      Family History  Problem Relation Age of Onset  . Clotting disorder Mother   . Cirrhosis Father   . Heart disease Sister   . Cirrhosis Sister   . Heart disease Brother   . Colon cancer Neg Hx   . Colon polyps Neg Hx     Medications- reviewed and updated Current Outpatient Medications  Medication Sig Dispense Refill  . aspirin 81 MG tablet Take 81 mg by mouth  daily.      . cetirizine (ZYRTEC) 10 MG tablet Take 10 mg by mouth daily.    . Multiple Vitamin (MULTIVITAMIN) tablet Take 1 tablet by mouth daily as needed.     No current facility-administered medications for this visit.    Allergies-reviewed and updated Allergies  Allergen Reactions  . Ivp Dye [Iodinated Diagnostic Agents] Nausea And Vomiting  . Aleve [Naproxen Sodium] Itching and Rash    Social  History   Socioeconomic History  . Marital status: Married    Spouse name: Not on file  . Number of children: Not on file  . Years of education: Not on file  . Highest education level: Not on file  Occupational History  . Not on file  Tobacco Use  . Smoking status: Former Smoker    Types: Cigarettes    Quit date: 03/01/1989    Years since quitting: 30.9  . Smokeless tobacco: Never Used  Substance and Sexual Activity  . Alcohol use: Yes    Comment: 1-2 drinks daily  . Drug use: No  . Sexual activity: Yes  Other Topics Concern  . Not on file  Social History Narrative  . Not on file   Social Determinants of Health   Financial Resource Strain:   . Difficulty of Paying Living Expenses: Not on file  Food Insecurity:   . Worried About Charity fundraiser in the Last Year: Not on file  . Ran Out of Food in the Last Year: Not on file  Transportation Needs:   . Lack of Transportation (Medical): Not on file  . Lack of Transportation (Non-Medical): Not on file  Physical Activity:   . Days of Exercise per Week: Not on file  . Minutes of Exercise per Session: Not on file  Stress:   . Feeling of Stress : Not on file  Social Connections:   . Frequency of Communication with Friends and Family: Not on file  . Frequency of Social Gatherings with Friends and Family: Not on file  . Attends Religious Services: Not on file  . Active Member of Clubs or Organizations: Not on file  . Attends Archivist Meetings: Not on file  . Marital Status: Not on file        Objective:  Physical Exam: BP 133/88   Pulse 64   Temp 98.3 F (36.8 C) (Temporal)   Ht 6\' 1"  (1.854 m)   Wt 231 lb (104.8 kg)   SpO2 98%   BMI 30.48 kg/m   Body mass index is 30.48 kg/m. Wt Readings from Last 3 Encounters:  01/21/20 231 lb (104.8 kg)  01/20/17 232 lb (105.2 kg)  10/18/16 240 lb (108.9 kg)   Gen: NAD, resting comfortably HEENT: TMs normal bilaterally. OP clear. No thyromegaly noted.  CV:  RRR with no murmurs appreciated Pulm: NWOB, CTAB with no crackles, wheezes, or rhonchi GI: Normal bowel sounds present. Soft, Nontender, Nondistended. MSK: no edema, cyanosis, or clubbing noted Skin: warm, dry Neuro: CN2-12 grossly intact. Strength 5/5 in upper and lower extremities. Reflexes symmetric and intact bilaterally.  Psych: Normal affect and thought content     Samik Balkcom M. [name], MD 01/21/2020 11:27 AM

## 2020-01-21 NOTE — Assessment & Plan Note (Signed)
Stable. Continue zyrtec 10mg  daily.

## 2020-01-21 NOTE — Assessment & Plan Note (Signed)
Check A1c. 

## 2020-01-21 NOTE — Assessment & Plan Note (Signed)
Stable.  Continue management per urology. 

## 2020-01-22 DIAGNOSIS — N200 Calculus of kidney: Secondary | ICD-10-CM | POA: Diagnosis not present

## 2020-01-22 DIAGNOSIS — R3912 Poor urinary stream: Secondary | ICD-10-CM | POA: Diagnosis not present

## 2020-01-22 DIAGNOSIS — N401 Enlarged prostate with lower urinary tract symptoms: Secondary | ICD-10-CM | POA: Diagnosis not present

## 2020-01-22 LAB — LIPID PANEL
Cholesterol: 141 mg/dL (ref <200)
HDL: 50 mg/dL (ref >=40)
LDL Cholesterol (Calc): 73 mg/dL (calc)
Non-HDL Cholesterol (Calc): 91 mg/dL (calc) (ref <130)
Total CHOL/HDL Ratio: 2.8 (calc) (ref <5.0)
Triglycerides: 99 mg/dL (ref <150)

## 2020-01-22 LAB — CBC
HCT: 43.7 % (ref 38.5–50.0)
Hemoglobin: 14.8 g/dL (ref 13.2–17.1)
MCH: 32.2 pg (ref 27.0–33.0)
MCHC: 33.9 g/dL (ref 32.0–36.0)
MCV: 95.2 fL (ref 80.0–100.0)
MPV: 9.4 fL (ref 7.5–12.5)
Platelets: 220 10*3/uL (ref 140–400)
RBC: 4.59 10*6/uL (ref 4.20–5.80)
RDW: 12.6 % (ref 11.0–15.0)
WBC: 7.2 10*3/uL (ref 3.8–10.8)

## 2020-01-22 LAB — COMPREHENSIVE METABOLIC PANEL
AG Ratio: 2 (calc) (ref 1.0–2.5)
ALT: 29 U/L (ref 9–46)
AST: 23 U/L (ref 10–35)
Albumin: 3.9 g/dL (ref 3.6–5.1)
Alkaline phosphatase (APISO): 88 U/L (ref 35–144)
BUN: 18 mg/dL (ref 7–25)
CO2: 26 mmol/L (ref 20–32)
Calcium: 9.2 mg/dL (ref 8.6–10.3)
Chloride: 106 mmol/L (ref 98–110)
Creat: 1.24 mg/dL (ref 0.70–1.25)
Globulin: 2 g/dL (calc) (ref 1.9–3.7)
Glucose, Bld: 85 mg/dL (ref 65–99)
Potassium: 4.4 mmol/L (ref 3.5–5.3)
Sodium: 140 mmol/L (ref 135–146)
Total Bilirubin: 0.9 mg/dL (ref 0.2–1.2)
Total Protein: 5.9 g/dL — ABNORMAL LOW (ref 6.1–8.1)

## 2020-01-22 LAB — HEMOGLOBIN A1C
Hgb A1c MFr Bld: 5 % of total Hgb (ref <5.7)
Mean Plasma Glucose: 97 (calc)
eAG (mmol/L): 5.4 (calc)

## 2020-01-22 LAB — HEPATITIS C ANTIBODY
Hepatitis C Ab: NONREACTIVE
SIGNAL TO CUT-OFF: 0.04 (ref <1.00)

## 2020-01-22 LAB — TSH: TSH: 1.66 mIU/L (ref 0.40–4.50)

## 2020-01-23 NOTE — Progress Notes (Signed)
Please inform patient of the following:  Good news! Blood work all looks great. No need to make any changes to his treatment plan at this time. Would like for him to keep up the good work and we can recheck in a year.  Trevor Potter. [name], MD 01/23/2020 8:03 AM

## 2020-02-07 DIAGNOSIS — N201 Calculus of ureter: Secondary | ICD-10-CM | POA: Diagnosis not present

## 2020-02-07 DIAGNOSIS — N281 Cyst of kidney, acquired: Secondary | ICD-10-CM | POA: Diagnosis not present

## 2020-02-07 DIAGNOSIS — K5730 Diverticulosis of large intestine without perforation or abscess without bleeding: Secondary | ICD-10-CM | POA: Diagnosis not present

## 2020-02-07 DIAGNOSIS — R1032 Left lower quadrant pain: Secondary | ICD-10-CM | POA: Diagnosis not present

## 2020-02-07 DIAGNOSIS — R1084 Generalized abdominal pain: Secondary | ICD-10-CM | POA: Diagnosis not present

## 2020-02-07 DIAGNOSIS — N132 Hydronephrosis with renal and ureteral calculous obstruction: Secondary | ICD-10-CM | POA: Diagnosis not present

## 2020-02-12 DIAGNOSIS — Z23 Encounter for immunization: Secondary | ICD-10-CM | POA: Diagnosis not present

## 2020-02-14 DIAGNOSIS — N202 Calculus of kidney with calculus of ureter: Secondary | ICD-10-CM | POA: Diagnosis not present

## 2020-02-28 DIAGNOSIS — R1032 Left lower quadrant pain: Secondary | ICD-10-CM | POA: Diagnosis not present

## 2020-02-28 DIAGNOSIS — N201 Calculus of ureter: Secondary | ICD-10-CM | POA: Diagnosis not present

## 2020-02-28 DIAGNOSIS — R3912 Poor urinary stream: Secondary | ICD-10-CM | POA: Diagnosis not present

## 2020-06-10 DIAGNOSIS — Z1152 Encounter for screening for COVID-19: Secondary | ICD-10-CM | POA: Diagnosis not present

## 2020-12-03 ENCOUNTER — Encounter: Payer: Self-pay | Admitting: Gastroenterology

## 2021-01-01 ENCOUNTER — Ambulatory Visit: Payer: Medicare Other | Admitting: Gastroenterology

## 2021-01-01 ENCOUNTER — Encounter: Payer: Self-pay | Admitting: Gastroenterology

## 2021-01-01 VITALS — BP 120/80 | HR 76 | Ht 71.5 in | Wt 224.5 lb

## 2021-01-01 DIAGNOSIS — Z1211 Encounter for screening for malignant neoplasm of colon: Secondary | ICD-10-CM | POA: Diagnosis not present

## 2021-01-01 DIAGNOSIS — Z8379 Family history of other diseases of the digestive system: Secondary | ICD-10-CM

## 2021-01-01 NOTE — Progress Notes (Signed)
HPI : Trevor Potter is a very pleasant 70 year old male referred by Dimas Chyle, MD for family history of cirrhosis and for screening colonoscopy.  The patient states that his sister was diagnosed with nonalcoholic fatty liver disease and cirrhosis, and her gastroenterologist told her that her family members needed to get tested for cirrhosis.  The patient's father also had cirrhosis and died from it in the year 09/10/1998.  He does not know the etiology of his father cirrhosis.  The patient does not have any risk factors for nonalcoholic fatty liver disease such as obesity, diabetes, hypertension or hyperlipidemia.  He is a light to moderate alcohol drinker, drinking only a few drinks per week.  He did have mildly elevated aminotransferases several years ago, which she was told was secondary to PPI use.  He stopped taking PPI, and his liver enzymes normalized.  He had an ultrasound of his liver in 09/09/2012 which showed normal echogenicity.  He had a normal ferritin level in 10-Sep-2006.  The patient knows of no family history of hemochromatosis. The patient had his last screening colonoscopy in October 2012 and a small sigmoid polyp was removed. He has a history of GERD status post Nissen fundoplication 30 years ago.  The surgery completely fixed his GERD symptoms.  He has not had GERD or needed to take acid suppression since then.  He denies any other chronic GI symptoms such as abdominal pain, constipation, diarrhea or blood in the stool.  Past Medical History:  Diagnosis Date   Colon polyp 03/12/2011   Sigmoid polyp   GERD (gastroesophageal reflux disease)    Hiatal hernia    Kidney stones    Ulcer      Past Surgical History:  Procedure Laterality Date   CERVICAL FUSION     with plates and screws   COLONOSCOPY     LEFT HEART CATH AND CORONARY ANGIOGRAPHY N/A 10/19/2016   Procedure: Left Heart Cath and Coronary Angiography;  Surgeon: Burnell Blanks, MD;  Location: [facility];  Service:  Cardiovascular;  Laterality: N/A;   NISSEN FUNDOPLICATION     ROTATOR CUFF REPAIR Right    UPPER GASTROINTESTINAL ENDOSCOPY     Family History  Problem Relation Age of Onset   Clotting disorder Mother    Varicose Veins Mother    Cirrhosis Father    Heart disease Sister    Cirrhosis Sister    Diabetes Sister    Heart disease Brother    Colon cancer Neg Hx    Colon polyps Neg Hx    Social History   Tobacco Use   Smoking status: Former    Types: Cigarettes    Quit date: 03/01/1989    Years since quitting: 31.8   Smokeless tobacco: Never  Vaping Use   Vaping Use: Never used  Substance Use Topics   Alcohol use: Yes    Comment: 1-2 drinks daily   Drug use: No   Current Outpatient Medications  Medication Sig Dispense Refill   cetirizine (ZYRTEC) 10 MG tablet Take 10 mg by mouth daily.     Multiple Vitamin (MULTIVITAMIN) tablet Take 1 tablet by mouth daily as needed.     Saw Palmetto, Serenoa repens, (SAW PALMETTO PO) Take 1 capsule by mouth 2 (two) times daily.     No current facility-administered medications for this visit.   Allergies  Allergen Reactions   Ivp Dye [Iodinated Diagnostic Agents] Nausea And Vomiting   Aleve [Naproxen Sodium] Itching and Rash  Review of Systems: All systems reviewed and negative except where noted in HPI.    No results found.  Physical Exam: BP 120/80 (BP Location: Left Arm, Patient Position: Sitting, Cuff Size: Normal)   Pulse 76   Ht 5' 11.5" (1.816 m) Comment: height measured without shoes  Wt 224 lb 8 oz (101.8 kg)   BMI 30.88 kg/m  Constitutional: Pleasant,well-developed, Caucasian male in no acute distress. HEENT: Normocephalic and atraumatic. Conjunctivae are normal. No scleral icterus.  Mallampati 3 Cardiovascular: Normal rate, regular rhythm.  Pulmonary/chest: Effort normal and breath sounds normal. No wheezing, rales or rhonchi. Abdominal: Soft, nondistended, nontender. Bowel sounds active throughout. There are no  masses palpable. No hepatomegaly. Extremities: no edema Lymphadenopathy: No cervical adenopathy noted. Neurological: Alert and oriented to person place and time. Skin: Skin is warm and dry. No rashes noted. Psychiatric: Normal mood and affect. Behavior is normal.  CBC    Component Value Date/Time   WBC 7.2 01/21/2020 1138   RBC 4.59 01/21/2020 1138   HGB 14.8 01/21/2020 1138   HCT 43.7 01/21/2020 1138   PLT 220 01/21/2020 1138   MCV 95.2 01/21/2020 1138   MCH 32.2 01/21/2020 1138   MCHC 33.9 01/21/2020 1138   RDW 12.6 01/21/2020 1138   LYMPHSABS 1.0 01/20/2017 1024   MONOABS 0.4 01/20/2017 1024   EOSABS 0.1 01/20/2017 1024   BASOSABS 0.0 01/20/2017 1024    CMP     Component Value Date/Time   NA 140 01/21/2020 1138   K 4.4 01/21/2020 1138   CL 106 01/21/2020 1138   CO2 26 01/21/2020 1138   GLUCOSE 85 01/21/2020 1138   BUN 18 01/21/2020 1138   CREATININE 1.24 01/21/2020 1138   CALCIUM 9.2 01/21/2020 1138   PROT 5.9 (L) 01/21/2020 1138   ALBUMIN 4.1 01/20/2017 1024   AST 23 01/21/2020 1138   ALT 29 01/21/2020 1138   ALKPHOS 80 01/20/2017 1024   BILITOT 0.9 01/21/2020 1138   GFRNONAA 52 (L) 01/20/2017 1024   GFRAA >60 01/20/2017 1024   Results for Trevor Potter, Trevor Potter (MRN [id number]) as of 01/01/2021 10:01  Ref. Range 08/25/2006 13:41  Ferritin Latest Ref Range: 22.0 - 322.0 ng/mL 226.9    RADIOLOGY REPORT*   Clinical Data:  Right upper quadrant abdominal pain, nausea   COMPLETE ABDOMINAL ULTRASOUND   Comparison:  CT abdomen of 02/12/2004   Findings:   Gallbladder:  The gallbladder is visualized and no gallstones are  noted.  There is no pain over the gallbladder with compression.   Common bile duct:  The common bile duct is normal measuring 4.3.   Liver:  The liver has a normal echogenic pattern.  No ductal  dilatation is seen.   IVC:  No abnormality is noted.   Pancreas:  The pancreas is obscured by bowel gas and cannot be  evaluated.   Spleen:  The  spleen is normal measuring 10.6 cm sagittally.   Right Kidney:  No hydronephrosis is seen.  The right kidney  measures 11.3 cm sagittally with some cortical thinning.  Parapelvic cysts are noted.   Left Kidney:  No hydronephrosis is noted.  The left kidney measures  12.2 cm with some cortical thinning as well.  Parapelvic cysts also  are noted on the left.   Abdominal aorta:  The abdominal aorta is normal in caliber.   IMPRESSION:   1.  No gallstones. No ductal dilatation.  2.  The pancreas is obscured by bowel gas.  3.  Apparent bilateral parapelvic  cysts.  Cortical thinning  bilaterally.  No definite hydronephrosis.   CLINICAL DATA:  Right lower quadrant pain, nausea   EXAM: CT ABDOMEN AND PELVIS WITHOUT CONTRAST   TECHNIQUE: Multidetector CT imaging of the abdomen and pelvis was performed following the standard protocol without IV contrast.   COMPARISON:  None.   FINDINGS: Lower chest: Minor dependent basilar atelectasis. Normal heart size. No pericardial or pleural effusion. Patient is status post Nissen fundoplication repair. No recurrent hiatal hernia.   Hepatobiliary: No focal liver abnormality is seen. No gallstones, gallbladder wall thickening, or biliary dilatation.   Pancreas: Unremarkable. No pancreatic ductal dilatation or surrounding inflammatory changes.   Spleen: Normal in size without focal abnormality.   Adrenals/Urinary Tract: Normal adrenal glands.   Both kidneys demonstrate chronic perinephric strandy edema and punctate nonobstructing intrarenal calculi. Parapelvic cysts present bilaterally.   Right kidney demonstrates mild hydronephrosis and associated right hydroureter extending into the pelvis. No obstructing right ureteral calculus however there is a dependent calculus in the bladder measuring 4 mm compatible with a recently passed stone, suspected from the right urinary tract. No other bladder abnormality. Left ureter is normal in  appearance.   Stomach/Bowel: Negative for bowel obstruction, significant dilatation, ileus, free air. No fluid collection or abscess. Normal appendix. Colonic diverticulosis evident. No acute inflammatory process.   Vascular/Lymphatic: Minor abdominal atherosclerosis. Negative for aneurysm. No adenopathy.   Reproductive: Mild prostate enlargement.   Other: No abdominal wall hernia or abnormality. No abdominopelvic ascites.   Musculoskeletal: Bones are osteopenic. 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 No acute compression fracture   IMPRESSION: 4 mm dependent bladder calculus, suspect recent stone passage from the right urinary tract system with residual right mild hydroureteronephrosis.   Additional punctate nonobstructing intrarenal calculi bilaterally.   Negative for appendicitis     Electronically Signed   By: Jerilynn Mages.  Shick M.D.   On: 01/20/2017 11:25     ASSESSMENT AND PLAN: 70 year old male with family history of cirrhosis (sister and father).  His sister says she has nonalcoholic fatty liver disease and that her gastroenterologist had recommended her family members get tested.  The patient does not have risk factors for nonalcoholic fatty liver disease or alcoholic liver disease.  He had a normal ultrasound in 2014 and has had normal liver enzymes in the past few years.  Although hemochromatosis can cause hereditary liver disease, the patient had a normal ferritin at age 4.  If the patient had hemochromatosis, this would not have been normal.  I do not see any indication to perform any further evaluation for liver disease in this patient. The patient is due for his next screening colonoscopy.  His last colonoscopy in October 2012 was notable for a single sigmoid polyp, which was non-adenomatous.  Colon cancer screening - Schedule patient for routine screening  colonoscopy  Family history of cirrhosis -He says his sister tells him she has NASH cirrhosis, father died of cirrhosis of unknown etiology - Patient has had normal liver enzymes and normal ultrasound recently and has no risk factors for NAFLD or alcoholic liver disease.  Normal ferritin 12 years ago - I see no indication to perform further evaluation for liver disease in this patient  The details, risks (including bleeding, perforation, infection, missed lesions, medication reactions and possible hospitalization or surgery if complications occur), benefits, and alternatives to colonoscopy with possible biopsy and possible polypectomy were discussed with the patient  and he consents to proceed.   I spent a total of 35 minutes reviewing the patient's medical record, interviewing and examining the patient, discussing her diagnosis and management of her condition going forward, and documenting in the medical record   Earma Nicolaou E. Candis Schatz, MD [facility]

## 2021-01-01 NOTE — Patient Instructions (Signed)
It was my pleasure to provide care to you today. Based on our discussion, I am providing you with my recommendations below:  RECOMMENDATION(S):   You have been scheduled for a colonoscopy. Please follow written instructions given to you at your visit today.   PREP:   Please pick up your prep supplies at the pharmacy within the next 1-3 days.  INHALERS:   If you use inhalers (even only as needed), please bring them with you on the day of your procedure.  COLONOSCOPY TIPS:  To reduce nausea and dehydration, stay well hydrated for 3-4 days prior to the exam.  To prevent skin/hemorrhoid irritation - prior to wiping, put A&Dointment or vaseline on the toilet paper. Keep a towel or pad on the bed.  BEFORE STARTING YOUR PREP, drink  64oz of clear liquids in the morning. This will help to flush the colon and will ensure you are well hydrated!!!!  NOTE - This is in addition to the fluids required for to complete your prep. Use of a flavored hard candy, such as grape Anise Salvo, can counteract some of the flavor of the prep and may prevent some nausea.   FOLLOW UP:  After your procedure, you will receive a call from my office staff regarding my recommendation for follow up.  BMI:  If you are age 69 or older, your body mass index should be between 23-30. Your Body mass index is 30.88 kg/m. If this is out of the aforementioned range listed, please consider follow up with your Primary Care Provider.  MY CHART:  The [facility] providers would like to encourage you to use [facility] to communicate with providers for non-urgent requests or questions.  Due to long hold times on the telephone, sending your provider a message by [facility] may be a faster and more efficient way to get a response.  Please allow 48 business hours for a response.  Please remember that this is for non-urgent requests.   Thank you for trusting me with your gastrointestinal care!    Scott E. Candis Schatz, MD

## 2021-01-27 DIAGNOSIS — H2513 Age-related nuclear cataract, bilateral: Secondary | ICD-10-CM | POA: Diagnosis not present

## 2021-02-05 DIAGNOSIS — N200 Calculus of kidney: Secondary | ICD-10-CM | POA: Diagnosis not present

## 2021-02-05 DIAGNOSIS — R3912 Poor urinary stream: Secondary | ICD-10-CM | POA: Diagnosis not present

## 2021-02-05 DIAGNOSIS — N4340 Spermatocele of epididymis, unspecified: Secondary | ICD-10-CM | POA: Diagnosis not present

## 2021-02-05 DIAGNOSIS — N401 Enlarged prostate with lower urinary tract symptoms: Secondary | ICD-10-CM | POA: Diagnosis not present

## 2021-02-17 ENCOUNTER — Ambulatory Visit (INDEPENDENT_AMBULATORY_CARE_PROVIDER_SITE_OTHER): Payer: Medicare Other | Admitting: Family Medicine

## 2021-02-17 ENCOUNTER — Encounter: Payer: Self-pay | Admitting: Family Medicine

## 2021-02-17 ENCOUNTER — Other Ambulatory Visit: Payer: Self-pay

## 2021-02-17 VITALS — BP 120/75 | HR 66 | Temp 98.5°F | Ht 71.5 in | Wt 226.2 lb

## 2021-02-17 DIAGNOSIS — G5622 Lesion of ulnar nerve, left upper limb: Secondary | ICD-10-CM | POA: Diagnosis not present

## 2021-02-17 DIAGNOSIS — J309 Allergic rhinitis, unspecified: Secondary | ICD-10-CM

## 2021-02-17 DIAGNOSIS — H6122 Impacted cerumen, left ear: Secondary | ICD-10-CM

## 2021-02-17 DIAGNOSIS — Z13220 Encounter for screening for lipoid disorders: Secondary | ICD-10-CM

## 2021-02-17 DIAGNOSIS — R739 Hyperglycemia, unspecified: Secondary | ICD-10-CM | POA: Diagnosis not present

## 2021-02-17 DIAGNOSIS — Z0001 Encounter for general adult medical examination with abnormal findings: Secondary | ICD-10-CM

## 2021-02-17 DIAGNOSIS — Z6831 Body mass index (BMI) 31.0-31.9, adult: Secondary | ICD-10-CM

## 2021-02-17 DIAGNOSIS — E669 Obesity, unspecified: Secondary | ICD-10-CM

## 2021-02-17 DIAGNOSIS — G5620 Lesion of ulnar nerve, unspecified upper limb: Secondary | ICD-10-CM | POA: Insufficient documentation

## 2021-02-17 DIAGNOSIS — R600 Localized edema: Secondary | ICD-10-CM | POA: Insufficient documentation

## 2021-02-17 LAB — CBC
HCT: 42 % (ref 39.0–52.0)
Hemoglobin: 14.6 g/dL (ref 13.0–17.0)
MCHC: 34.7 g/dL (ref 30.0–36.0)
MCV: 92.9 fl (ref 78.0–100.0)
Platelets: 236 10*3/uL (ref 150.0–400.0)
RBC: 4.52 Mil/uL (ref 4.22–5.81)
RDW: 13.2 % (ref 11.5–15.5)
WBC: 6.7 10*3/uL (ref 4.0–10.5)

## 2021-02-17 LAB — COMPREHENSIVE METABOLIC PANEL
ALT: 34 U/L (ref 0–53)
AST: 25 U/L (ref 0–37)
Albumin: 3.9 g/dL (ref 3.5–5.2)
Alkaline Phosphatase: 85 U/L (ref 39–117)
BUN: 17 mg/dL (ref 6–23)
CO2: 24 mEq/L (ref 19–32)
Calcium: 9 mg/dL (ref 8.4–10.5)
Chloride: 108 mEq/L (ref 96–112)
Creatinine, Ser: 1.26 mg/dL (ref 0.40–1.50)
GFR: 58.31 mL/min — ABNORMAL LOW (ref >60.00)
Glucose, Bld: 90 mg/dL (ref 70–99)
Potassium: 4.3 mEq/L (ref 3.5–5.1)
Sodium: 140 mEq/L (ref 135–145)
Total Bilirubin: 0.9 mg/dL (ref 0.2–1.2)
Total Protein: 6.3 g/dL (ref 6.0–8.3)

## 2021-02-17 LAB — LIPID PANEL
Cholesterol: 139 mg/dL (ref 0–200)
HDL: 49.5 mg/dL (ref >39.00)
LDL Cholesterol: 75 mg/dL (ref 0–99)
NonHDL: 89.9
Total CHOL/HDL Ratio: 3
Triglycerides: 76 mg/dL (ref 0.0–149.0)
VLDL: 15.2 mg/dL (ref 0.0–40.0)

## 2021-02-17 LAB — HEMOGLOBIN A1C: Hgb A1c MFr Bld: 5.2 % (ref 4.6–6.5)

## 2021-02-17 LAB — TSH: TSH: 1.31 u[IU]/mL (ref 0.35–5.50)

## 2021-02-17 NOTE — Assessment & Plan Note (Addendum)
Check A1c. 

## 2021-02-17 NOTE — Assessment & Plan Note (Signed)
Stable on Zyrtec as needed.

## 2021-02-17 NOTE — Progress Notes (Signed)
Chief Complaint:  Trevor Potter is a 70 y.o. male who presents today for his annual comprehensive physical exam.    Assessment/Plan:  New/Acute Problems: Skin Lesion Likely benign nevus.  He will follow-up with dermatology to discuss excisional biopsy  Cerumen impaction  Successfully irrigated by RMA today.  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  We will continue with watchful waiting.  Discussed home exercises and handout was given.  We will consider referral to sports medicine if not improving.  Leg edema Likely due to mild venous insufficiency.  No red flags.  We will check labs today.  Discussed conservative measures including salt avoidance, leg elevation, and compression.  Allergic rhinitis Stable on Zyrtec as needed.  Hyperglycemia Check A1c.   Body mass index is 31.11 kg/m. / Obese  BMI Metric Follow Up - 02/17/21 1400       BMI Metric Follow Up-Please document annually   BMI Metric Follow Up Education provided              Preventative Healthcare: He will be getting flu and COVID-vaccine at the pharmacy.  We will check labs today.  Will be getting colonoscopy later this year.  Patient Counseling(The following topics were reviewed and/or handout was given):  -Nutrition: Stressed importance of moderation in sodium/caffeine intake, saturated fat and cholesterol, caloric balance, sufficient intake of fresh fruits, vegetables, and fiber.  -Stressed the importance of regular exercise.   -Substance Abuse: Discussed cessation/primary prevention of tobacco, alcohol, or other drug use; driving or other dangerous activities under the influence; availability of treatment for abuse.   -Injury prevention: Discussed safety belts, safety helmets, smoke detector, smoking near bedding or upholstery.   -Sexuality: Discussed sexually transmitted diseases, partner selection, use of condoms, avoidance of unintended  pregnancy and contraceptive alternatives.   -Dental health: Discussed importance of regular tooth brushing, flossing, and dental visits.  -Health maintenance and immunizations reviewed. Please refer to Health maintenance section.  Return to care in 1 year for next preventative visit.     Subjective:  HPI:  He has a few issues he would like to discuss today.  He has noticed bilateral lower extremity swelling for the last several months to years.  Left usually worse than right.  Worse towards end of the day.  Better with elevation and compression.  Has also noticed intermittent tingling in his left hand.  Had symptoms over several years ago that was related to a pinched nerve in his neck.  He has a lesion on his left temporal area that he would like to have looked at today.  He is also concerned about earwax blockage.  Has noticed decreased hearing in his left ear.  Lifestyle Diet: Plenty of vegetables.  Exercise: Very busy with yard work.   Depression screen PHQ 2/9 02/17/2021  Decreased Interest 0  Down, Depressed, Hopeless 0  PHQ - 2 Score 0    Health Maintenance Due  Topic Date Due   TETANUS/TDAP  Never done   Zoster Vaccines- Shingrix (1 of 2) Never done   COVID-19 Vaccine (3 - Booster for Pfizer series) 12/13/2019     ROS: Per HPI, otherwise a complete review of systems was negative.   PMH:  The following were reviewed and entered/updated in epic: Past Medical History:  Diagnosis Date   Colon polyp 03/12/2011   Sigmoid polyp   GERD (gastroesophageal reflux disease)    Hiatal hernia    Kidney stones  Ulcer    Patient Active Problem List   Diagnosis Date Noted   Leg edema 02/17/2021   Ulnar neuropathy 02/17/2021   Hyperglycemia 01/21/2020   History of kidney stones 01/21/2020   Allergic rhinitis 01/21/2020   S/P Nissen fundoplication (without gastrostomy tube) procedure 07/18/2012   Diverticulosis of colon without hemorrhage 07/18/2012   Past Surgical  History:  Procedure Laterality Date   CERVICAL FUSION     with plates and screws   COLONOSCOPY     LEFT HEART CATH AND CORONARY ANGIOGRAPHY N/A 10/19/2016   Procedure: Left Heart Cath and Coronary Angiography;  Surgeon: Burnell Blanks, MD;  Location: [facility];  Service: Cardiovascular;  Laterality: N/A;   NISSEN FUNDOPLICATION     ROTATOR CUFF REPAIR Right    UPPER GASTROINTESTINAL ENDOSCOPY      Family History  Problem Relation Age of Onset   Clotting disorder Mother    Varicose Veins Mother    Cirrhosis Father    Heart disease Sister    Cirrhosis Sister    Diabetes Sister    Heart disease Brother    Colon cancer Neg Hx    Colon polyps Neg Hx     Medications- reviewed and updated Current Outpatient Medications  Medication Sig Dispense Refill   cetirizine (ZYRTEC) 10 MG tablet Take 10 mg by mouth daily.     Multiple Vitamin (MULTIVITAMIN) tablet Take 1 tablet by mouth daily as needed.     Saw Palmetto, Serenoa repens, (SAW PALMETTO PO) Take 1 capsule by mouth 2 (two) times daily.     No current facility-administered medications for this visit.    Allergies-reviewed and updated Allergies  Allergen Reactions   Ivp Dye [Iodinated Diagnostic Agents] Nausea And Vomiting   Aleve [Naproxen Sodium] Itching and Rash    Social History   Socioeconomic History   Marital status: Married    Spouse name: Not on file   Number of children: 3   Years of education: Not on file   Highest education level: Not on file  Occupational History   Occupation: retired  Tobacco Use   Smoking status: Former    Types: Cigarettes    Quit date: 03/01/1989    Years since quitting: 31.9   Smokeless tobacco: Never  Vaping Use   Vaping Use: Never used  Substance and Sexual Activity   Alcohol use: Yes    Comment: 1-2 drinks daily   Drug use: No   Sexual activity: Yes  Other Topics Concern   Not on file  Social History Narrative   Not on file   Social Determinants of  Health   Financial Resource Strain: Not on file  Food Insecurity: Not on file  Transportation Needs: Not on file  Physical Activity: Not on file  Stress: Not on file  Social Connections: Not on file        Objective:  Physical Exam: BP 120/75   Pulse 66   Temp 98.5 F (36.9 C) (Temporal)   Ht 5' 11.5" (1.816 m)   Wt 226 lb 3.2 oz (102.6 kg)   SpO2 97%   BMI 31.11 kg/m   Body mass index is 31.11 kg/m. Wt Readings from Last 3 Encounters:  02/17/21 226 lb 3.2 oz (102.6 kg)  01/01/21 224 lb 8 oz (101.8 kg)  01/21/20 231 lb (104.8 kg)   Gen: NAD, resting comfortably HEENT: Left EAC OBSCURED BY CERUMEN OP clear. No thyromegaly noted.  CV: RRR with no murmurs appreciated Pulm: NWOB,  CTAB with no crackles, wheezes, or rhonchi GI: Normal bowel sounds present. Soft, Nontender, Nondistended. MSK: no edema, cyanosis, or clubbing noted.  Bilateral upper extremities well for range of motion and strength throughout.  Tinel's sign positive at left wrist, left medial epicondyle.  Spurling positive on the left. Skin: warm, dry.  Approximately 5 mm circular lesion on left temporal area Neuro: CN2-12 grossly intact. Strength 5/5 in upper and lower extremities. Reflexes symmetric and intact bilaterally.  Psych: Normal affect and thought content     Takesha Steger M. [name], MD 02/17/2021 2:00 PM

## 2021-02-17 NOTE — Assessment & Plan Note (Signed)
Likely due to mild venous insufficiency.  No red flags.  We will check labs today.  Discussed conservative measures including salt avoidance, leg elevation, and compression.

## 2021-02-17 NOTE — Assessment & Plan Note (Signed)
Symptoms are not overly bothersome at this point.  We will continue with watchful waiting.  Discussed home exercises and handout was given.  We will consider referral to sports medicine if not improving.

## 2021-02-17 NOTE — Patient Instructions (Signed)
It was very nice to see you today!  We will check blood work today.  I believe that you probably have a pinched nerve.  Please work on the exercises.  Let me know if your symptoms are not improving.  We will flush your ears today.  Please try to keep your legs elevated and avoid follow-up to help with your leg swelling.  I will see back in a year.  Please come back to see me sooner if needed.  Take care, Dr [name]  PLEASE NOTE:  If you had any lab tests please let us know if you have not heard back within a few days. You may see your results on mychart before we have a chance to review them but we will give you a call once they are reviewed by [place]. If we ordered any referrals today, please let us know if you have not heard from their office within the next week.   Please try these tips to maintain a healthy lifestyle:  Eat at least 3 REAL meals and 1-2 snacks per day.  Aim for no more than 5 hours between eating.  If you eat breakfast, please do so within one hour of getting up.   Each meal should contain half fruits/vegetables, one quarter protein, and one quarter carbs (no bigger than a computer mouse)  Cut down on sweet beverages. This includes juice, soda, and sweet tea.   Drink at least 1 glass of water with each meal and aim for at least 8 glasses per day  Exercise at least 150 minutes every week.    Preventive Care 12 Years and Older, Male Preventive care refers to lifestyle choices and visits with your health care provider that can promote health and wellness. This includes: A yearly physical exam. This is also called an annual wellness visit. Regular dental and eye exams. Immunizations. Screening for certain conditions. Healthy lifestyle choices, such as: Eating a healthy diet. Getting regular exercise. Not using drugs or products that contain nicotine and tobacco. Limiting alcohol use. What can I expect for my preventive care visit? Physical exam Your health care  provider will check your: Height and weight. These may be used to calculate your BMI (body mass index). BMI is a measurement that tells if you are at a healthy weight. Heart rate and blood pressure. Body temperature. Skin for abnormal spots. Counseling Your health care provider may ask you questions about your: Past medical problems. Family's medical history. Alcohol, tobacco, and drug use. Emotional well-being. Home life and relationship well-being. Sexual activity. Diet, exercise, and sleep habits. History of falls. Memory and ability to understand (cognition). Work and work Statistician. Access to firearms. What immunizations do I need? Vaccines are usually given at various ages, according to a schedule. Your health care provider will recommend vaccines for you based on your age, medical history, and lifestyle or other factors, such as travel or where you work. What tests do I need? Blood tests Lipid and cholesterol levels. These may be checked every 5 years, or more often depending on your overall health. Hepatitis C test. Hepatitis B test. Screening Lung cancer screening. You may have this screening every year starting at age 69 if you have a 30-pack-year history of smoking and currently smoke or have quit within the past 15 years. Colorectal cancer screening. All adults should have this screening starting at age 71 and continuing until age 24. Your health care provider may recommend screening at age 80 if you are at  increased risk. You will have tests every 1-10 years, depending on your results and the type of screening test. Prostate cancer screening. Recommendations will vary depending on your family history and other risks. Genital exam to check for testicular cancer or hernias. Diabetes screening. This is done by checking your blood sugar (glucose) after you have not eaten for a while (fasting). You may have this done every 1-3 years. Abdominal aortic aneurysm (AAA)  screening. You may need this if you are a current or former smoker. STD (sexually transmitted disease) testing, if you are at risk. Follow these instructions at home: Eating and drinking  Eat a diet that includes fresh fruits and vegetables, whole grains, lean protein, and low-fat dairy products. Limit your intake of foods with high amounts of sugar, saturated fats, and salt. Take vitamin and mineral supplements as recommended by your health care provider. Do not drink alcohol if your health care provider tells you not to drink. If you drink alcohol: Limit how much you have to 0-2 drinks a day. Be aware of how much alcohol is in your drink. In the [place] one drink equals one 12 oz bottle of beer (355 mL), one 5 oz glass of wine (148 mL), or one 1 oz glass of hard liquor (44 mL). Lifestyle Take daily care of your teeth and gums. Brush your teeth every morning and night with fluoride toothpaste. Floss one time each day. Stay active. Exercise for at least 30 minutes 5 or more days each week. Do not use any products that contain nicotine or tobacco, such as cigarettes, e-cigarettes, and chewing tobacco. If you need help quitting, ask your health care provider. Do not use drugs. If you are sexually active, practice safe sex. Use a condom or other form of protection to prevent STIs (sexually transmitted infections). Talk with your health care provider about taking a low-dose aspirin or statin. Find healthy ways to cope with stress, such as: Meditation, yoga, or listening to music. Journaling. Talking to a trusted person. Spending time with friends and family. Safety Always wear your seat belt while driving or riding in a vehicle. Do not drive: If you have been drinking alcohol. Do not ride with someone who has been drinking. When you are tired or distracted. While texting. Wear a helmet and other protective equipment during sports activities. If you have firearms in your house, make sure you  follow all gun safety procedures. What's next? Visit your health care provider once a year for an annual wellness visit. Ask your health care provider how often you should have your eyes and teeth checked. Stay up to date on all vaccines. This information is not intended to replace advice given to you by your health care provider. Make sure you discuss any questions you have with your health care provider. Document Revised: 07/25/2020 Document Reviewed: 05/11/2018 Elsevier Patient Education  2022 [employer].

## 2021-02-18 NOTE — Progress Notes (Signed)
Please inform patient of the following:  Labs are all stable. Do not need to make any changes to his treatment plan at this time. We can recheck in a year.

## 2021-03-12 ENCOUNTER — Ambulatory Visit (AMBULATORY_SURGERY_CENTER): Payer: Medicare Other | Admitting: Gastroenterology

## 2021-03-12 ENCOUNTER — Encounter: Payer: Medicare Other | Admitting: Gastroenterology

## 2021-03-12 ENCOUNTER — Other Ambulatory Visit: Payer: Self-pay

## 2021-03-12 ENCOUNTER — Encounter: Payer: Self-pay | Admitting: Gastroenterology

## 2021-03-12 VITALS — BP 103/66 | HR 59 | Temp 98.2°F | Resp 10 | Ht 71.2 in | Wt 224.0 lb

## 2021-03-12 DIAGNOSIS — Z1211 Encounter for screening for malignant neoplasm of colon: Secondary | ICD-10-CM | POA: Diagnosis not present

## 2021-03-12 DIAGNOSIS — D122 Benign neoplasm of ascending colon: Secondary | ICD-10-CM | POA: Diagnosis not present

## 2021-03-12 DIAGNOSIS — D120 Benign neoplasm of cecum: Secondary | ICD-10-CM

## 2021-03-12 DIAGNOSIS — D123 Benign neoplasm of transverse colon: Secondary | ICD-10-CM

## 2021-03-12 DIAGNOSIS — K219 Gastro-esophageal reflux disease without esophagitis: Secondary | ICD-10-CM | POA: Diagnosis not present

## 2021-03-12 DIAGNOSIS — Z8379 Family history of other diseases of the digestive system: Secondary | ICD-10-CM

## 2021-03-12 MED ORDER — SODIUM CHLORIDE 0.9 % IV SOLN: 500.0000 mL | Freq: Once | INTRAVENOUS | Status: DC

## 2021-03-12 NOTE — Progress Notes (Signed)
VS- Trevor Potter 

## 2021-03-12 NOTE — Patient Instructions (Signed)
Thank you for allowing [place] to care for you today. Await final pathology of removed polyps, approximately 7-10 days.  Will make recommendation at that time for future colonoscopy. Resume previous diet and medications today.  Return to normal daily activities tomorrow. Handouts given for polyps and diverticulosis.   YOU HAD AN ENDOSCOPIC PROCEDURE TODAY AT [facility]:   Refer to the procedure report that was given to you for any specific questions about what was found during the examination.  If the procedure report does not answer your questions, please call your gastroenterologist to clarify.  If you requested that your care partner not be given the details of your procedure findings, then the procedure report has been included in a sealed envelope for you to review at your convenience later.  YOU SHOULD EXPECT: Some feelings of bloating in the abdomen. Passage of more gas than usual.  Walking can help get rid of the air that was put into your GI tract during the procedure and reduce the bloating. If you had a lower endoscopy (such as a colonoscopy or flexible sigmoidoscopy) you may notice spotting of blood in your stool or on the toilet paper. If you underwent a bowel prep for your procedure, you may not have a normal bowel movement for a few days.  Please Note:  You might notice some irritation and congestion in your nose or some drainage.  This is from the oxygen used during your procedure.  There is no need for concern and it should clear up in a day or so.  SYMPTOMS TO REPORT IMMEDIATELY:  Following lower endoscopy (colonoscopy or flexible sigmoidoscopy):  Excessive amounts of blood in the stool  Significant tenderness or worsening of abdominal pains  Swelling of the abdomen that is new, acute  Fever of 100F or higher    For urgent or emergent issues, a gastroenterologist can be reached at any hour by calling [phone]. Do not use MyChart messaging for urgent  concerns.    DIET:  We do recommend a small meal at first, but then you may proceed to your regular diet.  Drink plenty of fluids but you should avoid alcoholic beverages for 24 hours.  ACTIVITY:  You should plan to take it easy for the rest of today and you should NOT DRIVE or use heavy machinery until tomorrow (because of the sedation medicines used during the test).    FOLLOW UP: Our staff will call the number listed on your records 48-72 hours following your procedure to check on you and address any questions or concerns that you may have regarding the information given to you following your procedure. If we do not reach you, we will leave a message.  We will attempt to reach you two times.  During this call, we will ask if you have developed any symptoms of COVID 19. If you develop any symptoms (ie: fever, flu-like symptoms, shortness of breath, cough etc.) before then, please call [phone].  If you test positive for Covid 19 in the 2 weeks post procedure, please call and report this information to [place].    If any biopsies were taken you will be contacted by phone or by letter within the next 1-3 weeks.  Please call us at [phone] if you have not heard about the biopsies in 3 weeks.    SIGNATURES/CONFIDENTIALITY: You and/or your care partner have signed paperwork which will be entered into your electronic medical record.  These signatures attest to the fact  that that the information above on your After Visit Summary has been reviewed and is understood.  Full responsibility of the confidentiality of this discharge information lies with you and/or your care-partner.

## 2021-03-12 NOTE — Progress Notes (Signed)
Report given to PACU, vss 

## 2021-03-12 NOTE — Progress Notes (Signed)
[facility] History and Physical   Primary Care Physician:  Vivi Barrack, MD   Reason for Procedure:   Colon cancer screening  Plan:    Screening colonoscopy     HPI: Trevor Potter is a 70 y.o. male undergoing average risk screening colonoscopy.  He has no chronic GI symptoms and no family history of colon cancer.   Past Medical History:  Diagnosis Date   Allergy    Cataract    Colon polyp 03/12/2011   Sigmoid polyp   GERD (gastroesophageal reflux disease)    Hiatal hernia    Kidney stones    Ulcer     Past Surgical History:  Procedure Laterality Date   CERVICAL FUSION     with plates and screws   COLONOSCOPY     LEFT HEART CATH AND CORONARY ANGIOGRAPHY N/A 10/19/2016   Procedure: Left Heart Cath and Coronary Angiography;  Surgeon: Burnell Blanks, MD;  Location: [facility];  Service: Cardiovascular;  Laterality: N/A;   NISSEN FUNDOPLICATION     ROTATOR CUFF REPAIR Right    UPPER GASTROINTESTINAL ENDOSCOPY      Prior to Admission medications   Medication Sig Start Date End Date Taking? Authorizing Provider  cetirizine (ZYRTEC) 10 MG tablet Take 10 mg by mouth daily.   Yes [provider]  Multiple Vitamin (MULTIVITAMIN) tablet Take 1 tablet by mouth daily as needed.   Yes [provider]  Saw Palmetto, Serenoa repens, (SAW PALMETTO PO) Take 1 capsule by mouth 2 (two) times daily.   Yes [provider]    Current Outpatient Medications  Medication Sig Dispense Refill   cetirizine (ZYRTEC) 10 MG tablet Take 10 mg by mouth daily.     Multiple Vitamin (MULTIVITAMIN) tablet Take 1 tablet by mouth daily as needed.     Saw Palmetto, Serenoa repens, (SAW PALMETTO PO) Take 1 capsule by mouth 2 (two) times daily.     Current Facility-Administered Medications  Medication Dose Route Frequency Provider Last Rate Last Admin   0.9 %  sodium chloride infusion  500 mL Intravenous Once Daryel November, MD         Allergies as of 03/12/2021 - Review Complete 03/12/2021  Allergen Reaction Noted   Ivp dye [iodinated diagnostic agents] Nausea And Vomiting 03/02/2011   Aleve [naproxen sodium] Itching and Rash 03/02/2011    Family History  Problem Relation Age of Onset   Clotting disorder Mother    Varicose Veins Mother    Cirrhosis Father    Heart disease Sister    Cirrhosis Sister    Diabetes Sister    Heart disease Brother    Colon cancer Neg Hx    Colon polyps Neg Hx    Esophageal cancer Neg Hx    Stomach cancer Neg Hx    Rectal cancer Neg Hx     Social History   Socioeconomic History   Marital status: Married    Spouse name: Not on file   Number of children: 3   Years of education: Not on file   Highest education level: Not on file  Occupational History   Occupation: retired  Tobacco Use   Smoking status: Former    Types: Cigarettes    Quit date: 03/01/1989    Years since quitting: 32.0   Smokeless tobacco: Never  Vaping Use   Vaping Use: Never used  Substance and Sexual Activity   Alcohol use: Yes    Comment: 1-2 drinks daily  Drug use: No   Sexual activity: Yes  Other Topics Concern   Not on file  Social History Narrative   Not on file   Social Determinants of Health   Financial Resource Strain: Not on file  Food Insecurity: Not on file  Transportation Needs: Not on file  Physical Activity: Not on file  Stress: Not on file  Social Connections: Not on file  Intimate Partner Violence: Not on file    Review of Systems:  All other review of systems negative except as mentioned in the HPI.  Physical Exam: Vital signs BP 129/67   Pulse 70   Temp 98.2 F (36.8 C) (Temporal)   Ht 5' 11.2" (1.808 m)   Wt 224 lb (101.6 kg)   SpO2 99%   BMI 31.07 kg/m   General:   Alert,  Well-developed, well-nourished, pleasant and cooperative in NAD. MP1 Lungs:  Clear throughout to auscultation.   Heart:  Regular rate and rhythm; no murmurs, clicks, rubs,  or  gallops. Abdomen:  Soft, nontender and nondistended. Normal bowel sounds.   Neuro/Psych:   Normal mood and affect. A and O x 3   Jaydon Avina E. Candis Schatz, MD [facility]

## 2021-03-12 NOTE — Progress Notes (Signed)
Called to room to assist during endoscopic procedure.  Patient ID and intended procedure confirmed with present staff. Received instructions for my participation in the procedure from the performing physician.  

## 2021-03-12 NOTE — Op Note (Signed)
[facility] Patient Name: Trevor Potter Procedure Date: 03/12/2021 10:58 AM MRN: [id number] Endoscopist: Cash. Candis Schatz , MD Age: 70 Referring MD:  Date of Birth: 07-08-1950 Gender: Male Account #: [id number] Procedure:                Colonoscopy Indications:              Screening for colorectal malignant neoplasm Medicines:                Monitored Anesthesia Care Procedure:                Pre-Anesthesia Assessment:                           - Prior to the procedure, a History and Physical                            was performed, and patient medications and                            allergies were reviewed. The patient's tolerance of                            previous anesthesia was also reviewed. The risks                            and benefits of the procedure and the sedation                            options and risks were discussed with the patient.                            All questions were answered, and informed consent                            was obtained. Prior Anticoagulants: The patient has                            taken no previous anticoagulant or antiplatelet                            agents. ASA Grade Assessment: II - A patient with                            mild systemic disease. After reviewing the risks                            and benefits, the patient was deemed in                            satisfactory condition to undergo the procedure.                           After obtaining informed consent, the colonoscope  was passed under direct vision. Throughout the                            procedure, the patient's blood pressure, pulse, and                            oxygen saturations were monitored continuously. The                            CF [id number] #[id number] was introduced through the anus                            and advanced to the the terminal ileum, with                            identification  of the appendiceal orifice and IC                            valve. The colonoscopy was performed without                            difficulty. The patient tolerated the procedure                            well. The quality of the bowel preparation was                            adequate. The terminal ileum, ileocecal valve,                            appendiceal orifice, and rectum were photographed.                            The bowel preparation used was Miralax and                            bisacodyl tablets via split dose instruction. Scope In: 11:08:32 AM Scope Out: 11:37:09 AM Scope Withdrawal Time: 0 hours 24 minutes 13 seconds  Total Procedure Duration: 0 hours 28 minutes 37 seconds  Findings:                 The perianal and digital rectal examinations were                            normal. Pertinent negatives include normal                            sphincter tone and no palpable rectal lesions.                           A 5 mm polyp was found in the cecum. The polyp was                            sessile. The polyp was  removed with a cold snare.                            Resection and retrieval were complete. Estimated                            blood loss was minimal.                           A 5 mm polyp was found in the ascending colon. The                            polyp was flat. The polyp was removed with a cold                            snare. Resection and retrieval were complete.                            Estimated blood loss was minimal.                           A 2 mm polyp was found in the ascending colon. The                            polyp was sessile. The polyp was removed with a                            cold biopsy forceps. Resection and retrieval were                            complete. Estimated blood loss was minimal.                           A 3 mm polyp was found in the transverse colon. The                            polyp was flat. The  polyp was removed with a cold                            snare. Resection and retrieval were complete.                            Estimated blood loss was minimal.                           A 5 mm polyp was found in the transverse colon. The                            polyp was sessile. The polyp was removed with a                            cold snare. Resection and retrieval were complete.  Estimated blood loss was minimal.                           Many small and large-mouthed diverticula were found                            in the sigmoid colon, descending colon and                            transverse colon. There was no evidence of                            diverticular bleeding.                           The exam was otherwise normal throughout the                            examined colon.                           The terminal ileum appeared normal.                           The retroflexed view of the distal rectum and anal                            verge was normal and showed no anal or rectal                            abnormalities. Complications:            No immediate complications. Estimated Blood Loss:     Estimated blood loss was minimal. Impression:               - One 5 mm polyp in the cecum, removed with a cold                            snare. Resected and retrieved.                           - One 5 mm polyp in the ascending colon, removed                            with a cold snare. Resected and retrieved.                           - One 2 mm polyp in the ascending colon, removed                            with a cold biopsy forceps. Resected and retrieved.                           - One 3 mm polyp in the transverse colon, removed  with a cold snare. Resected and retrieved.                           - One 5 mm polyp in the transverse colon, removed                            with a cold snare. Resected and  retrieved.                           - Moderate diverticulosis in the sigmoid colon, in                            the descending colon and in the transverse colon.                            There was no evidence of diverticular bleeding.                           - The examined portion of the ileum was normal.                           - The distal rectum and anal verge are normal on                            retroflexion view. Recommendation:           - Patient has a contact number available for                            emergencies. The signs and symptoms of potential                            delayed complications were discussed with the                            patient. Return to normal activities tomorrow.                            Written discharge instructions were provided to the                            patient.                           - Resume previous diet.                           - Continue present medications.                           - Await pathology results.                           - Repeat colonoscopy (date not yet determined) for  surveillance based on pathology results. Hunt Zajicek E. Candis Schatz, MD 03/12/2021 11:45:47 AM This report has been signed electronically.

## 2021-03-16 ENCOUNTER — Telehealth: Payer: Self-pay | Admitting: *Deleted

## 2021-03-16 NOTE — Telephone Encounter (Signed)
  Follow up Call-  Call back number 03/12/2021  Post procedure Call Back phone  # [phone]  Permission to leave phone message Yes  Some recent data might be hidden     Patient questions:  Do you have a fever, pain , or abdominal swelling? No. Pain Score  0 *  Have you tolerated food without any problems? Yes.    Have you been able to return to your normal activities? Yes.    Do you have any questions about your discharge instructions: Diet   No. Medications  No. Follow up visit  No.  Do you have questions or concerns about your Care? No.  Actions: * If pain score is 4 or above: No action needed, pain <4.

## 2021-03-17 DIAGNOSIS — H25013 Cortical age-related cataract, bilateral: Secondary | ICD-10-CM | POA: Diagnosis not present

## 2021-03-17 DIAGNOSIS — H2513 Age-related nuclear cataract, bilateral: Secondary | ICD-10-CM | POA: Diagnosis not present

## 2021-03-17 DIAGNOSIS — H18413 Arcus senilis, bilateral: Secondary | ICD-10-CM | POA: Diagnosis not present

## 2021-03-17 DIAGNOSIS — H25043 Posterior subcapsular polar age-related cataract, bilateral: Secondary | ICD-10-CM | POA: Diagnosis not present

## 2021-03-20 ENCOUNTER — Encounter: Payer: Self-pay | Admitting: Gastroenterology

## 2021-04-02 ENCOUNTER — Telehealth: Payer: Self-pay | Admitting: Family Medicine

## 2021-04-02 DIAGNOSIS — R3912 Poor urinary stream: Secondary | ICD-10-CM | POA: Diagnosis not present

## 2021-04-02 DIAGNOSIS — N200 Calculus of kidney: Secondary | ICD-10-CM | POA: Diagnosis not present

## 2021-04-02 DIAGNOSIS — N401 Enlarged prostate with lower urinary tract symptoms: Secondary | ICD-10-CM | POA: Diagnosis not present

## 2021-04-02 DIAGNOSIS — N4340 Spermatocele of epididymis, unspecified: Secondary | ICD-10-CM | POA: Diagnosis not present

## 2021-04-02 NOTE — Telephone Encounter (Signed)
Copied from [facility] [id number]. Topic: Medicare AWV >> Apr 02, 2021 11:31 AM Harris-Coley, Hannah Beat wrote: Reason for CRM: Declined AWV stated provider did AWV not chart in patient's file for 2022. khc

## 2021-06-08 DIAGNOSIS — H2513 Age-related nuclear cataract, bilateral: Secondary | ICD-10-CM | POA: Diagnosis not present

## 2021-06-08 DIAGNOSIS — H2511 Age-related nuclear cataract, right eye: Secondary | ICD-10-CM | POA: Diagnosis not present

## 2021-06-09 DIAGNOSIS — H2512 Age-related nuclear cataract, left eye: Secondary | ICD-10-CM | POA: Diagnosis not present

## 2021-06-17 DIAGNOSIS — L814 Other melanin hyperpigmentation: Secondary | ICD-10-CM | POA: Diagnosis not present

## 2021-06-17 DIAGNOSIS — D1801 Hemangioma of skin and subcutaneous tissue: Secondary | ICD-10-CM | POA: Diagnosis not present

## 2021-06-17 DIAGNOSIS — L578 Other skin changes due to chronic exposure to nonionizing radiation: Secondary | ICD-10-CM | POA: Diagnosis not present

## 2021-06-17 DIAGNOSIS — L821 Other seborrheic keratosis: Secondary | ICD-10-CM | POA: Diagnosis not present

## 2021-06-29 DIAGNOSIS — H52202 Unspecified astigmatism, left eye: Secondary | ICD-10-CM | POA: Diagnosis not present

## 2021-06-29 DIAGNOSIS — H2513 Age-related nuclear cataract, bilateral: Secondary | ICD-10-CM | POA: Diagnosis not present

## 2021-06-29 DIAGNOSIS — H2512 Age-related nuclear cataract, left eye: Secondary | ICD-10-CM | POA: Diagnosis not present

## 2021-08-27 DIAGNOSIS — D225 Melanocytic nevi of trunk: Secondary | ICD-10-CM | POA: Diagnosis not present

## 2021-08-27 DIAGNOSIS — C44319 Basal cell carcinoma of skin of other parts of face: Secondary | ICD-10-CM | POA: Diagnosis not present

## 2021-08-27 DIAGNOSIS — D485 Neoplasm of uncertain behavior of skin: Secondary | ICD-10-CM | POA: Diagnosis not present

## 2021-08-27 DIAGNOSIS — D229 Melanocytic nevi, unspecified: Secondary | ICD-10-CM | POA: Diagnosis not present

## 2021-08-27 DIAGNOSIS — D3617 Benign neoplasm of peripheral nerves and autonomic nervous system of trunk, unspecified: Secondary | ICD-10-CM | POA: Diagnosis not present

## 2021-09-28 DIAGNOSIS — H43811 Vitreous degeneration, right eye: Secondary | ICD-10-CM | POA: Diagnosis not present

## 2021-09-28 DIAGNOSIS — H16223 Keratoconjunctivitis sicca, not specified as Sjogren's, bilateral: Secondary | ICD-10-CM | POA: Diagnosis not present

## 2021-09-28 DIAGNOSIS — H26493 Other secondary cataract, bilateral: Secondary | ICD-10-CM | POA: Diagnosis not present

## 2021-10-22 DIAGNOSIS — H26491 Other secondary cataract, right eye: Secondary | ICD-10-CM | POA: Diagnosis not present

## 2021-10-22 DIAGNOSIS — H18413 Arcus senilis, bilateral: Secondary | ICD-10-CM | POA: Diagnosis not present

## 2021-10-22 DIAGNOSIS — H26493 Other secondary cataract, bilateral: Secondary | ICD-10-CM | POA: Diagnosis not present

## 2021-10-22 DIAGNOSIS — Z961 Presence of intraocular lens: Secondary | ICD-10-CM | POA: Diagnosis not present

## 2021-11-06 DIAGNOSIS — C44319 Basal cell carcinoma of skin of other parts of face: Secondary | ICD-10-CM | POA: Diagnosis not present

## 2021-12-08 DIAGNOSIS — H26492 Other secondary cataract, left eye: Secondary | ICD-10-CM | POA: Diagnosis not present

## 2021-12-21 ENCOUNTER — Telehealth: Payer: Self-pay | Admitting: Family Medicine

## 2021-12-21 NOTE — Telephone Encounter (Signed)
Patient requests to be called at ph# [phone] to be scheduled Medicare Annual Wellness Visit

## 2021-12-25 ENCOUNTER — Ambulatory Visit (INDEPENDENT_AMBULATORY_CARE_PROVIDER_SITE_OTHER): Payer: Medicare Other | Admitting: Family Medicine

## 2021-12-25 ENCOUNTER — Encounter: Payer: Self-pay | Admitting: Family Medicine

## 2021-12-25 VITALS — BP 132/84 | HR 64 | Temp 98.4°F | Ht 71.2 in | Wt 230.4 lb

## 2021-12-25 DIAGNOSIS — J309 Allergic rhinitis, unspecified: Secondary | ICD-10-CM

## 2021-12-25 MED ORDER — AZELASTINE HCL 0.1 % NA SOLN: 2.0000 | Freq: Two times a day (BID) | NASAL | 12 refills | Status: AC

## 2021-12-25 MED ORDER — AMOXICILLIN-POT CLAVULANATE 875-125 MG PO TABS: 1.0000 | ORAL_TABLET | Freq: Two times a day (BID) | ORAL | 0 refills | Status: DC

## 2021-12-25 NOTE — Progress Notes (Signed)
   Trevor Potter is a 71 y.o. male who presents today for an office visit.  Assessment/Plan:  New/Acute Problems: Right Ear Pain Possibly underlying sinus infection given his physical exam.  We discussed empiric treatment versus imaging with CT scan.  He wishes for empiric treatment today.  We will start course of Augmentin and Astelin.  It is also possible that his TMJ pain is contributing to this as well.  We discussed a course of anti-inflammatories however he deferred for now.  He will follow-up with me in a couple weeks if not improving.  At that time would consider empiric treatment for TMJ versus imaging.  We discussed reasons to return to care.  Chronic Problems Addressed Today: Allergic rhinitis Not controlled.  Possibly contributing to above.  He will continue taking his Zyrtec.  We will add on Astelin nasal spray.  He can use this seasonally as needed.     Subjective:  HPI:   Patient here with right ear and face pain.  This started several months ago after having cataract surgery.  Had several revisions since then as well.  He follow back up with his optometrist and ophthalmologist who told him it was possibly a sinus infection.  They did not think it was related to his surgery.  He has tried taking over-the-counter decongestions without much improvement.  He has also noticed little bit of ringing in his ear the last several weeks as well.  He is also been feeling more unsteady on his feet.  Is been having a lot of congestion but this is normal for him due to seasonal allergies.       Objective:  Physical Exam: BP 132/84   Pulse 64   Temp 98.4 F (36.9 C) (Temporal)   Ht 5' 11.2" (1.808 m)   Wt 230 lb 6.4 oz (104.5 kg)   SpO2 97%   BMI 31.95 kg/m   Gen: No acute distress, resting comfortably HEENT: Bilateral TM with clear effusion.  OP clear.  Nasal mucosa erythematous and boggy bilaterally.  TMJ tenderness noted on right side. CV: Regular rate and rhythm with no murmurs  appreciated Pulm: Normal work of breathing, clear to auscultation bilaterally with no crackles, wheezes, or rhonchi Neuro: Grossly normal, moves all extremities Psych: Normal affect and thought content      Cypress Hinkson M. [name], MD 12/25/2021 10:24 AM

## 2021-12-25 NOTE — Patient Instructions (Signed)
It was very nice to see you today!  We will treat you for a sinus infection.  Please start the Augmentin and Astelin.  Let me know if not proving in the next 1 to 2 weeks and we can discuss treatment for TMJ or look for other possible causes.  Take care, Dr [name]  PLEASE NOTE:  If you had any lab tests please let us know if you have not heard back within a few days. You may see your results on mychart before we have a chance to review them but we will give you a call once they are reviewed by [place]. If we ordered any referrals today, please let us know if you have not heard from their office within the next week.   Please try these tips to maintain a healthy lifestyle:  Eat at least 3 REAL meals and 1-2 snacks per day.  Aim for no more than 5 hours between eating.  If you eat breakfast, please do so within one hour of getting up.   Each meal should contain half fruits/vegetables, one quarter protein, and one quarter carbs (no bigger than a computer mouse)  Cut down on sweet beverages. This includes juice, soda, and sweet tea.   Drink at least 1 glass of water with each meal and aim for at least 8 glasses per day  Exercise at least 150 minutes every week.

## 2021-12-25 NOTE — Assessment & Plan Note (Signed)
Not controlled.  Possibly contributing to above.  He will continue taking his Zyrtec.  We will add on Astelin nasal spray.  He can use this seasonally as needed.

## 2022-02-18 ENCOUNTER — Ambulatory Visit (INDEPENDENT_AMBULATORY_CARE_PROVIDER_SITE_OTHER): Payer: Medicare Other | Admitting: Family Medicine

## 2022-02-18 ENCOUNTER — Other Ambulatory Visit (INDEPENDENT_AMBULATORY_CARE_PROVIDER_SITE_OTHER): Payer: Medicare Other

## 2022-02-18 ENCOUNTER — Encounter: Payer: Self-pay | Admitting: Family Medicine

## 2022-02-18 VITALS — BP 120/72 | HR 64 | Temp 97.8°F | Ht 71.2 in | Wt 228.0 lb

## 2022-02-18 DIAGNOSIS — J309 Allergic rhinitis, unspecified: Secondary | ICD-10-CM

## 2022-02-18 DIAGNOSIS — R739 Hyperglycemia, unspecified: Secondary | ICD-10-CM

## 2022-02-18 DIAGNOSIS — J329 Chronic sinusitis, unspecified: Secondary | ICD-10-CM

## 2022-02-18 DIAGNOSIS — Z23 Encounter for immunization: Secondary | ICD-10-CM | POA: Diagnosis not present

## 2022-02-18 DIAGNOSIS — Z0000 Encounter for general adult medical examination without abnormal findings: Secondary | ICD-10-CM | POA: Diagnosis not present

## 2022-02-18 DIAGNOSIS — Z0001 Encounter for general adult medical examination with abnormal findings: Secondary | ICD-10-CM

## 2022-02-18 DIAGNOSIS — Z13220 Encounter for screening for lipoid disorders: Secondary | ICD-10-CM

## 2022-02-18 LAB — COMPREHENSIVE METABOLIC PANEL
ALT: 28 U/L (ref 0–53)
AST: 20 U/L (ref 0–37)
Albumin: 3.8 g/dL (ref 3.5–5.2)
Alkaline Phosphatase: 80 U/L (ref 39–117)
BUN: 21 mg/dL (ref 6–23)
CO2: 27 mEq/L (ref 19–32)
Calcium: 8.9 mg/dL (ref 8.4–10.5)
Chloride: 110 mEq/L (ref 96–112)
Creatinine, Ser: 1.45 mg/dL (ref 0.40–1.50)
GFR: 48.92 mL/min — ABNORMAL LOW (ref >60.00)
Glucose, Bld: 77 mg/dL (ref 70–99)
Potassium: 4.5 mEq/L (ref 3.5–5.1)
Sodium: 142 mEq/L (ref 135–145)
Total Bilirubin: 1 mg/dL (ref 0.2–1.2)
Total Protein: 6.2 g/dL (ref 6.0–8.3)

## 2022-02-18 LAB — LIPID PANEL
Cholesterol: 145 mg/dL (ref 0–200)
HDL: 47.1 mg/dL (ref >39.00)
LDL Cholesterol: 80 mg/dL (ref 0–99)
NonHDL: 97.42
Total CHOL/HDL Ratio: 3
Triglycerides: 87 mg/dL (ref 0.0–149.0)
VLDL: 17.4 mg/dL (ref 0.0–40.0)

## 2022-02-18 LAB — CBC
HCT: 40 % (ref 39.0–52.0)
Hemoglobin: 14 g/dL (ref 13.0–17.0)
MCHC: 34.9 g/dL (ref 30.0–36.0)
MCV: 93.9 fl (ref 78.0–100.0)
Platelets: 238 10*3/uL (ref 150.0–400.0)
RBC: 4.26 Mil/uL (ref 4.22–5.81)
RDW: 13.2 % (ref 11.5–15.5)
WBC: 6.7 10*3/uL (ref 4.0–10.5)

## 2022-02-18 NOTE — Assessment & Plan Note (Signed)
Symptoms are not controlled.  He is on Zyrtec.  He can continue taking his Astelin.  Does have some evidence of eustachian tube dysfunction and sinusitis.  We discussed CT scan versus referral to ENT.  Will refer to ENT today.

## 2022-02-18 NOTE — Addendum Note (Signed)
Addended by: Betti Cruz on: 02/18/2022 09:49 AM   Modules accepted: Orders

## 2022-02-18 NOTE — Assessment & Plan Note (Signed)
Check A1c. 

## 2022-02-18 NOTE — Progress Notes (Signed)
Chief Complaint:  Trevor Potter is a 71 y.o. male who presents today for his annual comprehensive physical exam.    Assessment/Plan:  Chronic Problems Addressed Today: Allergic rhinitis Symptoms are not controlled.  He is on Zyrtec.  He can continue taking his Astelin.  Does have some evidence of eustachian tube dysfunction and sinusitis.  We discussed CT scan versus referral to ENT.  Will refer to ENT today.  Hyperglycemia Check A1c.   Preventative Healthcare: We will get flu shot at the pharmacy.  Check labs today.  Up-to-date on colon cancer screening.  Pneumonia vaccine given today.   Patient Counseling(The following topics were reviewed and/or handout was given):  -Nutrition: Stressed importance of moderation in sodium/caffeine intake, saturated fat and cholesterol, caloric balance, sufficient intake of fresh fruits, vegetables, and fiber.  -Stressed the importance of regular exercise.   -Substance Abuse: Discussed cessation/primary prevention of tobacco, alcohol, or other drug use; driving or other dangerous activities under the influence; availability of treatment for abuse.   -Injury prevention: Discussed safety belts, safety helmets, smoke detector, smoking near bedding or upholstery.   -Sexuality: Discussed sexually transmitted diseases, partner selection, use of condoms, avoidance of unintended pregnancy and contraceptive alternatives.   -Dental health: Discussed importance of regular tooth brushing, flossing, and dental visits.  -Health maintenance and immunizations reviewed. Please refer to Health maintenance section.  Return to care in 1 year for next preventative visit.     Subjective:  HPI:  He has no acute complaints today.   We saw him about 6 weeks ago for ear pain and sinus congestion.  Tried a course of Astelin and Augmentin.  Symptoms not improved.  This is been going on since January of this year.  Astelin helped modestly.  No fevers or chills.  Still has a  lot of pressure and congestion.  Some pain in right eye as well.  Lifestyle Diet: Balanced. Plenty of fruits and vegetables.  Exercise: None specific.      02/18/2022    9:06 AM  Depression screen PHQ 2/9  Decreased Interest 0  Down, Depressed, Hopeless 0  PHQ - 2 Score 0   ROS: Per HPI, otherwise a complete review of systems was negative.   PMH:  The following were reviewed and entered/updated in epic: Past Medical History:  Diagnosis Date   Allergy    Cataract    Colon polyp 03/12/2011   Sigmoid polyp   GERD (gastroesophageal reflux disease)    Hiatal hernia    Kidney stones    Ulcer    Patient Active Problem List   Diagnosis Date Noted   Leg edema 02/17/2021   Ulnar neuropathy 02/17/2021   Hyperglycemia 01/21/2020   History of kidney stones 01/21/2020   Allergic rhinitis 01/21/2020   S/P Nissen fundoplication (without gastrostomy tube) procedure 07/18/2012   Diverticulosis of colon without hemorrhage 07/18/2012   Past Surgical History:  Procedure Laterality Date   CERVICAL FUSION     with plates and screws   COLONOSCOPY     LEFT HEART CATH AND CORONARY ANGIOGRAPHY N/A 10/19/2016   Procedure: Left Heart Cath and Coronary Angiography;  Surgeon: Burnell Blanks, MD;  Location: [facility];  Service: Cardiovascular;  Laterality: N/A;   NISSEN FUNDOPLICATION     ROTATOR CUFF REPAIR Right    UPPER GASTROINTESTINAL ENDOSCOPY      Family History  Problem Relation Age of Onset   Clotting disorder Mother    Varicose Veins Mother  Cirrhosis Father    Heart disease Sister    Cirrhosis Sister    Diabetes Sister    Heart disease Brother    Colon cancer Neg Hx    Colon polyps Neg Hx    Esophageal cancer Neg Hx    Stomach cancer Neg Hx    Rectal cancer Neg Hx     Medications- reviewed and updated Current Outpatient Medications  Medication Sig Dispense Refill   cetirizine (ZYRTEC) 10 MG tablet Take 10 mg by mouth daily.     Multiple Vitamin  (MULTIVITAMIN) tablet Take 1 tablet by mouth daily as needed.     Saw Palmetto, Serenoa repens, (SAW PALMETTO PO) Take 1 capsule by mouth 2 (two) times daily.     azelastine (ASTELIN) 0.1 % nasal spray Place 2 sprays into both nostrils 2 (two) times daily. (Patient not taking: Reported on 02/18/2022) 30 mL 12   No current facility-administered medications for this visit.    Allergies-reviewed and updated Allergies  Allergen Reactions   Ivp Dye [Iodinated Contrast Media] Nausea And Vomiting   Aleve [Naproxen Sodium] Itching and Rash    Social History   Socioeconomic History   Marital status: Married    Spouse name: Not on file   Number of children: 3   Years of education: Not on file   Highest education level: Not on file  Occupational History   Occupation: retired  Tobacco Use   Smoking status: Former    Types: Cigarettes    Quit date: 03/01/1989    Years since quitting: 32.9   Smokeless tobacco: Never  Vaping Use   Vaping Use: Never used  Substance and Sexual Activity   Alcohol use: Yes    Comment: 1-2 drinks daily   Drug use: No   Sexual activity: Yes  Other Topics Concern   Not on file  Social History Narrative   Not on file   Social Determinants of Health   Financial Resource Strain: Not on file  Food Insecurity: Not on file  Transportation Needs: Not on file  Physical Activity: Not on file  Stress: Not on file  Social Connections: Not on file        Objective:  Physical Exam: BP 120/72   Pulse 64   Temp 97.8 F (36.6 C) (Temporal)   Ht 5' 11.2" (1.808 m)   Wt 228 lb (103.4 kg)   SpO2 97%   BMI 31.62 kg/m   Body mass index is 31.62 kg/m. Wt Readings from Last 3 Encounters:  02/18/22 228 lb (103.4 kg)  12/25/21 230 lb 6.4 oz (104.5 kg)  03/12/21 224 lb (101.6 kg)   Gen: NAD, resting comfortably HEENT: TMs with clear effusion bilaterally.  OP erythematous.  Nasal mucosa erythematous and boggy bilaterally.  Decreased transillumination of  maxillary sinuses bilaterally. CV: RRR with no murmurs appreciated Pulm: NWOB, CTAB with no crackles, wheezes, or rhonchi GI: Normal bowel sounds present. Soft, Nontender, Nondistended. MSK: no edema, cyanosis, or clubbing noted Skin: warm, dry Neuro: CN2-12 grossly intact. Strength 5/5 in upper and lower extremities. Reflexes symmetric and intact bilaterally.  Psych: Normal affect and thought content     Saraiah Bhat M. [name], MD 02/18/2022 9:41 AM

## 2022-02-18 NOTE — Patient Instructions (Signed)
It was very nice to see you today!  We we will refer you to ear nose and throat.  Please continue to work on diet and exercise.  We will check blood work today.  I will see back in year for your next physical.  Come back sooner if needed.  Take care, Dr [name]  PLEASE NOTE:  If you had any lab tests please let us know if you have not heard back within a few days. You may see your results on mychart before we have a chance to review them but we will give you a call once they are reviewed by [place]. If we ordered any referrals today, please let us know if you have not heard from their office within the next week.   Please try these tips to maintain a healthy lifestyle:  Eat at least 3 REAL meals and 1-2 snacks per day.  Aim for no more than 5 hours between eating.  If you eat breakfast, please do so within one hour of getting up.   Each meal should contain half fruits/vegetables, one quarter protein, and one quarter carbs (no bigger than a computer mouse)  Cut down on sweet beverages. This includes juice, soda, and sweet tea.   Drink at least 1 glass of water with each meal and aim for at least 8 glasses per day  Exercise at least 150 minutes every week.

## 2022-02-19 LAB — TSH: TSH: 1.17 u[IU]/mL (ref 0.35–5.50)

## 2022-02-22 LAB — HEMOGLOBIN A1C: Hgb A1c MFr Bld: 5.2 % (ref 4.6–6.5)

## 2022-02-23 NOTE — Progress Notes (Signed)
Please inform patient of the following:  Good news! Labs are all stable. We can recheck everything in a year.

## 2022-02-27 ENCOUNTER — Encounter: Payer: Self-pay | Admitting: Family Medicine

## 2022-03-01 NOTE — Telephone Encounter (Signed)
Please advise 

## 2022-04-08 DIAGNOSIS — N200 Calculus of kidney: Secondary | ICD-10-CM | POA: Diagnosis not present

## 2022-04-08 DIAGNOSIS — N400 Enlarged prostate without lower urinary tract symptoms: Secondary | ICD-10-CM | POA: Diagnosis not present

## 2022-04-08 DIAGNOSIS — R3121 Asymptomatic microscopic hematuria: Secondary | ICD-10-CM | POA: Diagnosis not present

## 2022-04-08 LAB — PSA: PSA: 3.58

## 2022-04-27 DIAGNOSIS — J343 Hypertrophy of nasal turbinates: Secondary | ICD-10-CM | POA: Diagnosis not present

## 2022-04-27 DIAGNOSIS — J309 Allergic rhinitis, unspecified: Secondary | ICD-10-CM | POA: Diagnosis not present

## 2022-04-27 DIAGNOSIS — R519 Headache, unspecified: Secondary | ICD-10-CM | POA: Diagnosis not present

## 2022-04-29 DIAGNOSIS — N1330 Unspecified hydronephrosis: Secondary | ICD-10-CM | POA: Diagnosis not present

## 2022-04-29 DIAGNOSIS — R3121 Asymptomatic microscopic hematuria: Secondary | ICD-10-CM | POA: Diagnosis not present

## 2022-05-04 DIAGNOSIS — J342 Deviated nasal septum: Secondary | ICD-10-CM | POA: Diagnosis not present

## 2022-05-04 DIAGNOSIS — J341 Cyst and mucocele of nose and nasal sinus: Secondary | ICD-10-CM | POA: Diagnosis not present

## 2022-05-04 DIAGNOSIS — J3489 Other specified disorders of nose and nasal sinuses: Secondary | ICD-10-CM | POA: Diagnosis not present

## 2022-05-04 DIAGNOSIS — S0083XA Contusion of other part of head, initial encounter: Secondary | ICD-10-CM | POA: Diagnosis not present

## 2022-05-06 DIAGNOSIS — N201 Calculus of ureter: Secondary | ICD-10-CM | POA: Diagnosis not present

## 2022-05-11 ENCOUNTER — Other Ambulatory Visit: Payer: Self-pay | Admitting: Urology

## 2022-05-13 ENCOUNTER — Encounter (HOSPITAL_BASED_OUTPATIENT_CLINIC_OR_DEPARTMENT_OTHER): Payer: Self-pay | Admitting: Urology

## 2022-05-13 ENCOUNTER — Other Ambulatory Visit: Payer: Self-pay

## 2022-05-13 NOTE — Progress Notes (Signed)
Left message for patient to call back for Lithotripsy instructions for Monday May 17, 2022.

## 2022-05-13 NOTE — Progress Notes (Signed)
Lithotripsy pre procedure call complete.  Patient will arrive at 10:30 a.m. Monday 05/17/2022.  Patient's wife will be his driver and 24 hour caregiver.  Jackelyn Poling [phone])  all vitamins will be stopped.  Instructions reviewed and patient had no questions at this time.

## 2022-05-14 NOTE — H&P (Signed)
F/u -   1) BPH - weak st, hesitancy, frequency and urgency. These symptoms have been going on for years but getting worse. UA clear. Last PSA was 2.39 in 2018 and 3.9 in 2021. Never tried anything for it. No flank pain or gross hematuria.   His PSA was 3.28 in Sep 2022.    2) kidney stones - . Small stone on CT 2018 and essentially negative KUB 2019. Seen 09/21 with right flank pain and CT revealed a 6 mm right prox to mid ureteral stone and a 6 mm RUP stone (visible on kub), punctate left stones. F/u KUB equivocal and renal US with improvement of right hydro and parapelvic cysts. He saw the stone pass.   KUB November 2022 reveals a 6 to 7 mm right upper pole stone and a 3 mm left lower pole stone. These appear stable.   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 Pt has noted over past few years testcles/scrotum getting bigger again. It is heavy and bothersome. Get in the way and he sits on them.   Scrotal ultrasound November 2022 with about a 3.7 cm right spermatocele (s) and a 2.5 cm left spermatocele(s). Testicles appear normal without mass.    Today, seen for the above. KUB with 4 mm LLP stone. I don't see a right sided stone. He hasn't had flank pain or seen a stone pass. UA with 3-10 rbc. His cr was 1.45 Sep 2023 - we discussed his "kidney disease".   05/06/2022: CT imaging ordered after last office visit showed several nonobstructing renal calculi in the left kidney as well as some renal sinus cysts. On the right there was a 7 mm mid ureteral calculi without correlating obstructive signs. Patient now back today for follow-up exam and KUB. Patient had been grossly asymptomatic until this weekend when he developed gross hematuria with right lower back pain and discomfort. This persisted for about 2 days but then resolved. He has been asymptomatic since Monday of this week. Currently voiding at his baseline, not having any dysuria or  continued gross hematuria. No changes in force of stream or increased frequency/urgency. He denies any associated fevers or chills, nausea/vomiting. He has never had a stone procedure before.     ALLERGIES: Aleve TABS Contrast Dye Penicillins Tamsulosin    MEDICATIONS: Zyrtec  Multiple Vitamin  Saw Palmetto     GU PSH: Excision Bilateral Hydroceles - 2009 Removal of spermatocele - 2009       PSH Notes: Neck Surgery, Surgery Tunica Vaginalis Excision Of Hydrocele Bilateral, Surgery Excision Of Spermatocele W/ Epididymectomy Bilateral, Gastric Surgery   NON-GU PSH: No Non-GU PSH    GU PMH: Microscopic hematuria - 04/29/2022, - 04/08/2022 BPH w/o LUTS - 04/08/2022, (Stable), His prostate may have increased in size just slightly. His PSA remains low and stable. We discussed continued monitoring., - 2018 Renal calculus, KUB with stone not seen on the right. He hasn't had flank pain or stone passage. Given MH with get CT and cysto. - 04/08/2022, Disc KUB and stable. , - 04/02/2021 (Stable), check KUB , - 02/05/2021, Bilateral, - 2021, Bilateral, He has a right renal calculus that remained stable. He has passed the left ureteral stone. He didn't bring it in with him but I'm quite sure that's what occurred., - 2018, Bilateral kidney stones, - 2014 BPH w/LUTS - 04/02/2021, DRE benign - 50 g. PSA was sent, - 02/05/2021, Benign prostatic hyperplasia with urinary obstruction, - 2016 Spermatocele of epididymis, Unspec,  Disc nature r/b/a to spermatocelectomy and he will cont surveillance. - 04/02/2021, check scrotal US , - 02/05/2021, Spermatocele, - 09/06/12 Weak Urinary Stream (Stable) - 04/02/2021, - 02/05/2021, 09-07-19, PSA sent , - 2019/09/07 Ureteral calculus - 09/07/19, 09-07-19, 09/07/19 RLQ pain, Right, He has tenderness to palpation in the musculature of his lower abdomen. We discussed the fact that he has some form of muscular injury whether that is a tear or bruise I told him to use nonsteroidal anti-inflammatory  medication and a heating pad. 09-06-2017 Encounter for Prostate Cancer screening (Stable), His PSA is currently 2.39. This is up slightly from where it was 2 years ago consistent with some slight increase in size of his prostate secondary to BPH. 09/06/2016, Prostate cancer screening, - 09-07-14 Hematospermia, Hematospermia - September 06, 2012 Hydrocele, Unspec, Hydrocele, right - 09/06/2012, Testicular Hydrocele, - 2012/09/06 Unil Inguinal Hernia W/O obst or gang,non-recurrent, Inguinal hernia, right - 09/06/12 Varicocele, Varicocele - 2012-09-06      PMH Notes: He has a history of bilateral hydroceles/spermatoceles and underwent bilateral hydrocelectomy/spermatocelectomy in 9/09. At that time there was some hydrocele fluid around the testicles but I did not remove the parietal tunica vaginalis. I mainly performed spermatocelectomy's and replaced the testicle back in the tunica and closed it. He was then seen in 10/12 for a right hydrocele noted formed but was not causing any symptoms. We discussed surgical therapy and he elected to not proceed with surgery at that time.   Nephrolithiasis: CT scan 12/12 revealed 2 small stones in the lower pole of the right kidney as well. There is a small stone seen in the mid to lower pole of the left kidney.   Right renal cyst: Incidentally noted on CT scan 12/12 was a simple cyst in the lower pole of his right kidney.     NON-GU PMH: Encounter for general adult medical examination without abnormal findings, Encounter for preventive health examination - 2014/09/07 Personal history of other diseases of the digestive system, History of esophageal reflux - Sep 06, 2012, History of hiatal hernia, - 09/06/12 Personal history of other infectious and parasitic diseases, History of condyloma acuminatum - 2012-09-06    FAMILY HISTORY: Death In The Family Father - Runs In Family Family Health Status Number - Runs In Family hepatic failure - Father   SOCIAL HISTORY: Marital Status: Married Preferred Language: English; Ethnicity: Not  Hispanic Or Latino; Race: White Current Smoking Status: Patient does not smoke anymore. Has not smoked since 10/30/1991.   Tobacco Use Assessment Completed: Used Tobacco in last 30 days? Does drink.  Drinks 2 caffeinated drinks per day.     Notes: Former smoker, Occupation:, Tobacco Use, Caffeine Use, Alcohol Use, Marital History - Currently Married   REVIEW OF SYSTEMS:    GU Review Male:   Patient denies frequent urination, hard to postpone urination, burning/ pain with urination, get up at night to urinate, leakage of urine, stream starts and stops, trouble starting your stream, have to strain to urinate , erection problems, and penile pain.  Gastrointestinal (Upper):   Patient denies nausea, vomiting, and indigestion/ heartburn.  Gastrointestinal (Lower):   Patient denies diarrhea and constipation.  Constitutional:   Patient denies fever, night sweats, weight loss, and fatigue.  Skin:   Patient denies skin rash/ lesion and itching.  Eyes:   Patient denies blurred vision and double vision.  Ears/ Nose/ Throat:   Patient denies sore throat and sinus problems.  Hematologic/Lymphatic:   Patient denies swollen glands and easy bruising.  Cardiovascular:   Patient denies leg swelling and chest pains.  Respiratory:   Patient denies cough and shortness of breath.  Endocrine:   Patient denies excessive thirst.  Musculoskeletal:   Patient denies back pain and joint pain.  Neurological:   Patient denies headaches and dizziness.  Psychologic:   Patient denies depression and anxiety.   VITAL SIGNS:      05/06/2022 03:05 PM  BP 145/73 mmHg  Pulse 84 /min  Temperature 98.0 F / 36.6 C   MULTI-SYSTEM PHYSICAL EXAMINATION:    Constitutional: Well-nourished. No physical deformities. Normally developed. Good grooming.  Neck: Neck symmetrical, not swollen. Normal tracheal position.  Respiratory: No labored breathing, no use of accessory muscles.   Cardiovascular: Normal temperature, normal extremity  pulses, no swelling, no varicosities.  Skin: No paleness, no jaundice, no cyanosis. No lesion, no ulcer, no rash.  Neurologic / Psychiatric: Oriented to time, oriented to place, oriented to person. No depression, no anxiety, no agitation.  Gastrointestinal: No mass, no tenderness, no rigidity, non obese abdomen.  Musculoskeletal: Normal gait and station of head and neck.     Complexity of Data:  Source Of History:  Patient, Medical Record Summary  Records Review:   Previous Doctor Records, Previous Hospital Records, Previous Patient Records  Urine Test Review:   Urinalysis, Urine Culture  X-Ray Review: KUB: Reviewed Films. Discussed With Patient.  C.T. Hematuria: Reviewed Films. Reviewed Report. Discussed With Patient.     04/08/22 02/05/21 01/22/20 08/23/16 08/26/14 08/29/13 03/04/11 08/27/08  PSA  Total PSA 3.58 ng/mL 3.28 ng/mL 3.93 ng/mL 2.39 ng/dl 2.06  1.77  1.09  0.88    Notes:                     CLINICAL DATA: Microscopic hematuria. Nephrolithiasis.   EXAM:  CT ABDOMEN AND PELVIS WITHOUT CONTRAST   TECHNIQUE:  Multidetector CT imaging of the abdomen and pelvis was performed  following the standard protocol without IV contrast.   RADIATION DOSE REDUCTION: This exam was performed according to the  departmental dose-optimization program which includes automated  exposure control, adjustment of the mA and/or kV according to  patient size and/or use of iterative reconstruction technique.   COMPARISON: 02/07/2020   FINDINGS:  Lower chest: No acute findings.   Hepatobiliary: No mass visualized on this unenhanced exam.  Gallbladder is unremarkable. No evidence of biliary ductal  dilatation.   Pancreas: No mass or inflammatory process visualized on this  unenhanced exam.   Spleen: Within normal limits in size.   Adrenals/Urinary tract: Several tiny less than 5 mm calculi are  again seen in the left kidney. Renal sinus cysts are noted, however  there is no evidence of  hydronephrosis.   A 7 mm calculus is seen in the mid right ureter, which shows slight  distal migration since previous study. Previously seen mild right  hydronephrosis has resolved. Unremarkable unopacified urinary  bladder.   Stomach/Bowel: No evidence of obstruction, inflammatory process, or  abnormal fluid collections. Diverticulosis is seen mainly involving  the sigmoid colon, however there is no evidence of diverticulitis.   Vascular/Lymphatic: No pathologically enlarged lymph nodes  identified. No evidence of abdominal aortic aneurysm.   Reproductive: Mild-to-moderately enlarged prostate gland again  noted.   Other: None.   Musculoskeletal: No suspicious bone lesions identified. 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   IMPRESSION:  7 mm mid right ureteral  calculus. Resolution of mild right  hydronephrosis since prior study.   Tiny nonobstructing left renal calculi.   Colonic diverticulosis, without radiographic evidence of  diverticulitis.   Stable enlarged prostate.    Electronically Signed  By: Marlaine Hind M.D.  On: 04/29/2022 14:03      PROCEDURES:         KUB - [id number]  A single view of the abdomen is obtained. Continues with stable nonobstructing left renal calculi. An obvious opacity consistent with a right renal calculus is not visualized on today's exam. Tracing down the anatomical Zwicker tract of the right ureter ovoid shaped opacity is noted overlying the superior portion of the right sacral wing midline to the SI joint consistent with the previously identified right ureteral calculi seen on most recent CT imaging. I drew an arrow better identification in [place].      . Patient confirmed No Neulasta OnPro Device.     ASSESSMENT:      ICD-10 Details  1 GU:   Ureteral calculus - N20.1 Right, Acute, Complicated Injury   PLAN:           Schedule Return  Visit/Planned Activity: Next Available Appointment - Follow up MD, Schedule Surgery          Document Letter(s):  Created for Patient: Clinical Summary         Notes:   He had right lower back pain with gross hematuria over the weekend but that is since resolved. KUB shows continued right ureteral calculi when compared to most recent CT imaging. We discussed continued observation versus definitive intervention in the form of shockwave lithotripsy or ureteroscopy. Given the stones visibility, he would be a good candidate for ESWL.   For shockwave lithotripsy I described the risks which include arrhythmia, kidney contusion, kidney hemorrhage, need for transfusion, long-term risk of diabetes or hypertension, back discomfort, flank ecchymosis, flank abrasion, inability to break up stone, inability to pass stone fragments, Steinstrasse, infection associated with obstructing stones, need for different surgical procedure and possible need for repeat shockwave lithotripsy.   Patient allergic to tamsulosin. I offered pain medication today but he declined. He will continue to stay well-hydrated and monitor for any interval stone material passage. Appropriate return to clinic or ED follow-up instructions for acutely worsening/poorly controlled LUTS, pain/discomfort and correlating signs/symptoms of systemic infection. He will also monitor for interval stone material passage and contact the office in the event that occurs. All questions answered to the best of my ability regarding shockwave lithotripsy and its expected postoperative course. Understanding expressed by the patient, he elects to proceed. Imaging also reviewed with Dr. Junious Silk today, lithotripsy recommended. I will fill out appropriate paperwork and turn into the scheduler. They will then contact the patient to set this up next available.        Next Appointment:      Next Appointment: 06/18/2022 10:15 AM    Appointment Type: Male Cysto     Location: [facility] [phone]    Provider: Festus Aloe, M.D.    Reason for Visit: next avail cysto

## 2022-05-17 ENCOUNTER — Ambulatory Visit (HOSPITAL_COMMUNITY): Payer: Medicare Other

## 2022-05-17 ENCOUNTER — Encounter (HOSPITAL_BASED_OUTPATIENT_CLINIC_OR_DEPARTMENT_OTHER): Payer: Self-pay | Admitting: Urology

## 2022-05-17 ENCOUNTER — Ambulatory Visit (HOSPITAL_BASED_OUTPATIENT_CLINIC_OR_DEPARTMENT_OTHER)
Admission: RE | Admit: 2022-05-17 | Discharge: 2022-05-17 | Disposition: A | Discharge status: Home / Self Care | Payer: Medicare Other | Attending: Urology | Admitting: Urology

## 2022-05-17 ENCOUNTER — Encounter (HOSPITAL_BASED_OUTPATIENT_CLINIC_OR_DEPARTMENT_OTHER)
Admission: RE | Disposition: A | Discharge status: Home / Self Care | Payer: Self-pay | Source: Home / Self Care | Attending: Urology

## 2022-05-17 DIAGNOSIS — N200 Calculus of kidney: Secondary | ICD-10-CM | POA: Insufficient documentation

## 2022-05-17 DIAGNOSIS — K5730 Diverticulosis of large intestine without perforation or abscess without bleeding: Secondary | ICD-10-CM | POA: Insufficient documentation

## 2022-05-17 DIAGNOSIS — N201 Calculus of ureter: Secondary | ICD-10-CM | POA: Diagnosis not present

## 2022-05-17 DIAGNOSIS — N401 Enlarged prostate with lower urinary tract symptoms: Secondary | ICD-10-CM | POA: Diagnosis not present

## 2022-05-17 DIAGNOSIS — Z87891 Personal history of nicotine dependence: Secondary | ICD-10-CM | POA: Diagnosis not present

## 2022-05-17 HISTORY — PX: EXTRACORPOREAL SHOCK WAVE LITHOTRIPSY: SHX1557

## 2022-05-17 SURGERY — LITHOTRIPSY, ESWL
Anesthesia: LOCAL | Laterality: Right

## 2022-05-17 MED ORDER — ONDANSETRON HCL 4 MG PO TABS: 4.0000 mg | ORAL_TABLET | Freq: Four times a day (QID) | ORAL | 1 refills | Status: DC | PRN

## 2022-05-17 MED ORDER — OXYCODONE-ACETAMINOPHEN 5-325 MG PO TABS: 1.0000 | ORAL_TABLET | Freq: Four times a day (QID) | ORAL | 0 refills | Status: DC | PRN

## 2022-05-17 MED ORDER — CIPROFLOXACIN HCL 500 MG PO TABS
500.0000 mg | ORAL_TABLET | ORAL | Status: AC
  Administered 2022-05-17: 500 mg via ORAL

## 2022-05-17 MED ORDER — SODIUM CHLORIDE 0.9 % IV SOLN: INTRAVENOUS | Status: DC

## 2022-05-17 MED ORDER — DIAZEPAM 5 MG PO TABS
10.0000 mg | ORAL_TABLET | ORAL | Status: AC
  Administered 2022-05-17: 10 mg via ORAL

## 2022-05-17 MED ORDER — SODIUM CHLORIDE 0.9% FLUSH: 3.0000 mL | INTRAVENOUS | Status: DC | PRN

## 2022-05-17 MED ORDER — SODIUM CHLORIDE 0.9% FLUSH: 3.0000 mL | Freq: Two times a day (BID) | INTRAVENOUS | Status: DC

## 2022-05-17 MED ORDER — ONDANSETRON 4 MG PO TBDP
4.0000 mg | ORAL_TABLET | Freq: Once | ORAL | Status: AC
  Administered 2022-05-17: 4 mg via ORAL

## 2022-05-17 MED ORDER — ACETAMINOPHEN 325 MG PO TABS: 650.0000 mg | ORAL_TABLET | ORAL | Status: DC | PRN

## 2022-05-17 MED ORDER — DIAZEPAM 5 MG PO TABS
ORAL_TABLET | ORAL | Status: AC
  Filled 2022-05-17: qty 2

## 2022-05-17 MED ORDER — SODIUM CHLORIDE 0.9 % IV SOLN: 250.0000 mL | INTRAVENOUS | Status: DC | PRN

## 2022-05-17 MED ORDER — MORPHINE SULFATE (PF) 4 MG/ML IV SOLN: 2.0000 mg | INTRAVENOUS | Status: DC | PRN

## 2022-05-17 MED ORDER — OXYCODONE HCL 5 MG PO TABS: 5.0000 mg | ORAL_TABLET | ORAL | Status: DC | PRN

## 2022-05-17 MED ORDER — DIPHENHYDRAMINE HCL 25 MG PO CAPS
ORAL_CAPSULE | ORAL | Status: AC
  Filled 2022-05-17: qty 1

## 2022-05-17 MED ORDER — ACETAMINOPHEN 325 MG RE SUPP: 650.0000 mg | RECTAL | Status: DC | PRN

## 2022-05-17 MED ORDER — CIPROFLOXACIN HCL 500 MG PO TABS
ORAL_TABLET | ORAL | Status: AC
  Filled 2022-05-17: qty 1

## 2022-05-17 MED ORDER — ONDANSETRON 4 MG PO TBDP
ORAL_TABLET | ORAL | Status: AC
  Filled 2022-05-17: qty 1

## 2022-05-17 MED ORDER — DIPHENHYDRAMINE HCL 25 MG PO CAPS
25.0000 mg | ORAL_CAPSULE | ORAL | Status: AC
  Administered 2022-05-17: 25 mg via ORAL

## 2022-05-17 NOTE — Progress Notes (Signed)
Informed Dr. Jeffie Pollock of patient's nausea, inability to vomit, and IV discontinued. Disintegrating zofran ordered and administered. Also ordered phenergan '25mg'$  po if zofran ineffective.

## 2022-05-17 NOTE — Interval H&P Note (Signed)
History and Physical Interval Note: No change in stone location.  05/17/2022 12:01 PM  [name]  has presented today for surgery, with the diagnosis of RIGHT URETEROAL STONE.  The various methods of treatment have been discussed with the patient and family. After consideration of risks, benefits and other options for treatment, the patient has consented to  Procedure(s): RIGHT EXTRACORPOREAL SHOCK WAVE LITHOTRIPSY (ESWL) (Right) as a surgical intervention.  The patient's history has been reviewed, patient examined, no change in status, stable for surgery.  I have reviewed the patient's chart and labs.  Questions were answered to the patient's satisfaction.     Trevor Potter

## 2022-05-17 NOTE — Op Note (Signed)
See scanned [facility] note

## 2022-05-18 ENCOUNTER — Encounter (HOSPITAL_BASED_OUTPATIENT_CLINIC_OR_DEPARTMENT_OTHER): Payer: Self-pay | Admitting: Urology

## 2022-05-18 NOTE — Progress Notes (Signed)
Not sure why his xray came to [place] but it shows that his right kidney stone is no longer visible.  Trevor Potter. [name], MD 05/18/2022 12:58 PM

## 2022-05-19 ENCOUNTER — Encounter: Payer: Self-pay | Admitting: Family Medicine

## 2022-05-19 ENCOUNTER — Telehealth: Payer: Self-pay | Admitting: Family Medicine

## 2022-05-19 NOTE — Telephone Encounter (Signed)
Patient returned call

## 2022-05-20 NOTE — Telephone Encounter (Signed)
See result note.  

## 2022-06-03 DIAGNOSIS — N201 Calculus of ureter: Secondary | ICD-10-CM | POA: Diagnosis not present

## 2022-07-26 DIAGNOSIS — H16223 Keratoconjunctivitis sicca, not specified as Sjogren's, bilateral: Secondary | ICD-10-CM | POA: Diagnosis not present

## 2022-07-26 DIAGNOSIS — H43813 Vitreous degeneration, bilateral: Secondary | ICD-10-CM | POA: Diagnosis not present

## 2022-08-18 DIAGNOSIS — H04123 Dry eye syndrome of bilateral lacrimal glands: Secondary | ICD-10-CM | POA: Diagnosis not present

## 2022-08-18 DIAGNOSIS — H43813 Vitreous degeneration, bilateral: Secondary | ICD-10-CM | POA: Diagnosis not present

## 2022-08-18 DIAGNOSIS — H43392 Other vitreous opacities, left eye: Secondary | ICD-10-CM | POA: Diagnosis not present

## 2022-08-18 DIAGNOSIS — H01009 Unspecified blepharitis unspecified eye, unspecified eyelid: Secondary | ICD-10-CM | POA: Diagnosis not present

## 2022-08-24 DIAGNOSIS — K080 Exfoliation of teeth due to systemic causes: Secondary | ICD-10-CM | POA: Diagnosis not present

## 2022-09-08 DIAGNOSIS — H01009 Unspecified blepharitis unspecified eye, unspecified eyelid: Secondary | ICD-10-CM | POA: Diagnosis not present

## 2022-09-08 DIAGNOSIS — H43813 Vitreous degeneration, bilateral: Secondary | ICD-10-CM | POA: Diagnosis not present

## 2022-09-08 DIAGNOSIS — H04123 Dry eye syndrome of bilateral lacrimal glands: Secondary | ICD-10-CM | POA: Diagnosis not present

## 2022-09-08 DIAGNOSIS — H43312 Vitreous membranes and strands, left eye: Secondary | ICD-10-CM | POA: Diagnosis not present

## 2022-09-29 DIAGNOSIS — R051 Acute cough: Secondary | ICD-10-CM | POA: Diagnosis not present

## 2022-09-29 DIAGNOSIS — Z136 Encounter for screening for cardiovascular disorders: Secondary | ICD-10-CM | POA: Diagnosis not present

## 2022-09-30 ENCOUNTER — Encounter: Payer: Self-pay | Admitting: Family Medicine

## 2022-09-30 ENCOUNTER — Ambulatory Visit (INDEPENDENT_AMBULATORY_CARE_PROVIDER_SITE_OTHER): Payer: Medicare Other | Admitting: Family Medicine

## 2022-09-30 VITALS — BP 115/72 | HR 59 | Temp 97.7°F | Ht 71.25 in | Wt 225.6 lb

## 2022-09-30 DIAGNOSIS — R001 Bradycardia, unspecified: Secondary | ICD-10-CM | POA: Diagnosis not present

## 2022-09-30 DIAGNOSIS — R059 Cough, unspecified: Secondary | ICD-10-CM | POA: Diagnosis not present

## 2022-09-30 LAB — POCT RAPID STREP A (OFFICE): Rapid Strep A Screen: NEGATIVE

## 2022-09-30 NOTE — Patient Instructions (Addendum)
It was very nice to see you today!  Your EKG shows a healthy heart.  No cause for concern.  It is okay for you to get your surgery.  You probably have a sinus infection.  Please let us know if not improving by next week.  Return if symptoms worsen or fail to improve.   Take care, Dr Jimmey Ralph  PLEASE NOTE:  If you had any lab tests, please let us know if you have not heard back within a few days. You may see your results on mychart before we have a chance to review them but we will give you a call once they are reviewed by [place].   If we ordered any referrals today, please let us know if you have not heard from their office within the next week.   If you had any urgent prescriptions sent in today, please check with the pharmacy within an hour of our visit to make sure the prescription was transmitted appropriately.   Please try these tips to maintain a healthy lifestyle:  Eat at least 3 REAL meals and 1-2 snacks per day.  Aim for no more than 5 hours between eating.  If you eat breakfast, please do so within one hour of getting up.   Each meal should contain half fruits/vegetables, one quarter protein, and one quarter carbs (no bigger than a computer mouse)  Cut down on sweet beverages. This includes juice, soda, and sweet tea.   Drink at least 1 glass of water with each meal and aim for at least 8 glasses per day  Exercise at least 150 minutes every week.

## 2022-09-30 NOTE — Progress Notes (Signed)
   Trevor Potter is a 72 y.o. male who presents today for an office visit.  Assessment/Plan:  Cough Likely sinusitis.  It is not improving over the last day or so.  We did discuss potentially starting a antibiotic given that symptoms have been going on for 10 days or so however deferred for now.  His rapid strep today was negative.  Advised against using Afrin for long periods of time.  He will start using Astelin.  He will let us know if symptoms do not continue to improve over the next several days and would consider starting course of Augmentin at that time.  Abnormal EKG Reassured patient that his EKG from urgent care did not show any significant abnormalities.  He has regular interval with his QRS complexes and P waves are present-no signs of atrial fibrillation.  We did repeat his EKG in the office today which showed sinus bradycardia without any arrhythmias or signs of ischemic changes.  It is fine for him to proceed with his eye procedure as planned.    Subjective:  HPI:  Patient here for urgent care follow up. Went in for pre-operative evaluation for his upcoming eye surgery yesterday. Apparently the nurse was concerned about an irregular heart rate and told him to go to the ED immediately. He ended up goiing to UC yesterday for this and cough and congestion that been going on for 10 days or so.  Had chest x-ray there which is clear.  Was started on hydrocodone for cough.  He is not sure if this made much of a difference.  For his heart rate he did have an EKG. Apparently the PA that saw him was worried about "squiggly lines" and told him that he needed to follow up here ASAP.  No palpitations.  No chest pain.  No shortness of breath.       Objective:  Physical Exam: BP 115/72   Pulse (!) 59   Temp 97.7 F (36.5 C) (Temporal)   Ht 5' 11.25" (1.81 m)   Wt 225 lb 9.6 oz (102.3 kg)   SpO2 98%   BMI 31.24 kg/m   Gen: No acute distress, resting comfortably CV: Regular rate and  rhythm with no murmurs appreciated Pulm: Normal work of breathing, clear to auscultation bilaterally with no crackles, wheezes, or rhonchi Neuro: Grossly normal, moves all extremities Psych: Normal affect and thought content  EKG: Sinus bradycardia.  No ischemic changes.  EKG From Urgent care yesterday:    Time Spent: 45 minutes of total time was spent on the date of the encounter performing the following actions: chart review prior to seeing the patient, obtaining history, performing a medically necessary exam, reviewing outside information including his EKG from yesterday, counseling on the treatment plan, placing orders, and documenting in our EHR.        Katina Degree. Jimmey Ralph, MD 09/30/2022 10:08 AM

## 2022-10-11 ENCOUNTER — Telehealth: Payer: Self-pay | Admitting: *Deleted

## 2022-10-11 NOTE — Telephone Encounter (Signed)
[facility] form faxed to [phone] on 10/08/2022 Form placed to be scan in patient chart

## 2022-10-12 DIAGNOSIS — H43392 Other vitreous opacities, left eye: Secondary | ICD-10-CM | POA: Diagnosis not present

## 2022-10-20 DIAGNOSIS — Z09 Encounter for follow-up examination after completed treatment for conditions other than malignant neoplasm: Secondary | ICD-10-CM | POA: Diagnosis not present

## 2022-11-17 DIAGNOSIS — H43392 Other vitreous opacities, left eye: Secondary | ICD-10-CM | POA: Diagnosis not present

## 2022-11-18 DIAGNOSIS — K080 Exfoliation of teeth due to systemic causes: Secondary | ICD-10-CM | POA: Diagnosis not present

## 2023-01-13 ENCOUNTER — Encounter (INDEPENDENT_AMBULATORY_CARE_PROVIDER_SITE_OTHER): Payer: Self-pay

## 2023-02-22 ENCOUNTER — Encounter: Payer: Self-pay | Admitting: Family Medicine

## 2023-02-22 ENCOUNTER — Ambulatory Visit (INDEPENDENT_AMBULATORY_CARE_PROVIDER_SITE_OTHER): Payer: Medicare Other | Admitting: Family Medicine

## 2023-02-22 VITALS — BP 125/80 | HR 59 | Temp 97.8°F | Ht 71.5 in | Wt 228.0 lb

## 2023-02-22 DIAGNOSIS — J309 Allergic rhinitis, unspecified: Secondary | ICD-10-CM

## 2023-02-22 DIAGNOSIS — Z23 Encounter for immunization: Secondary | ICD-10-CM | POA: Diagnosis not present

## 2023-02-22 DIAGNOSIS — R739 Hyperglycemia, unspecified: Secondary | ICD-10-CM | POA: Diagnosis not present

## 2023-02-22 DIAGNOSIS — G5622 Lesion of ulnar nerve, left upper limb: Secondary | ICD-10-CM

## 2023-02-22 DIAGNOSIS — Z0000 Encounter for general adult medical examination without abnormal findings: Secondary | ICD-10-CM | POA: Diagnosis not present

## 2023-02-22 DIAGNOSIS — R600 Localized edema: Secondary | ICD-10-CM

## 2023-02-22 LAB — COMPREHENSIVE METABOLIC PANEL
ALT: 27 U/L (ref 0–53)
AST: 23 U/L (ref 0–37)
Albumin: 3.8 g/dL (ref 3.5–5.2)
Alkaline Phosphatase: 82 U/L (ref 39–117)
BUN: 17 mg/dL (ref 6–23)
CO2: 29 mEq/L (ref 19–32)
Calcium: 9.1 mg/dL (ref 8.4–10.5)
Chloride: 108 mEq/L (ref 96–112)
Creatinine, Ser: 1.36 mg/dL (ref 0.40–1.50)
GFR: 52.46 mL/min — ABNORMAL LOW (ref >60.00)
Glucose, Bld: 85 mg/dL (ref 70–99)
Potassium: 4.4 mEq/L (ref 3.5–5.1)
Sodium: 141 mEq/L (ref 135–145)
Total Bilirubin: 1 mg/dL (ref 0.2–1.2)
Total Protein: 5.8 g/dL — ABNORMAL LOW (ref 6.0–8.3)

## 2023-02-22 LAB — LIPID PANEL
Cholesterol: 149 mg/dL (ref 0–200)
HDL: 49.8 mg/dL (ref >39.00)
LDL Cholesterol: 73 mg/dL (ref 0–99)
NonHDL: 99
Total CHOL/HDL Ratio: 3
Triglycerides: 130 mg/dL (ref 0.0–149.0)
VLDL: 26 mg/dL (ref 0.0–40.0)

## 2023-02-22 LAB — CBC
HCT: 42.4 % (ref 39.0–52.0)
Hemoglobin: 14.3 g/dL (ref 13.0–17.0)
MCHC: 33.6 g/dL (ref 30.0–36.0)
MCV: 95 fl (ref 78.0–100.0)
Platelets: 242 10*3/uL (ref 150.0–400.0)
RBC: 4.47 Mil/uL (ref 4.22–5.81)
RDW: 13.1 % (ref 11.5–15.5)
WBC: 6.9 10*3/uL (ref 4.0–10.5)

## 2023-02-22 LAB — TSH: TSH: 1.66 u[IU]/mL (ref 0.35–5.50)

## 2023-02-22 LAB — HEMOGLOBIN A1C: Hgb A1c MFr Bld: 5.1 % (ref 4.6–6.5)

## 2023-02-22 LAB — PSA: PSA: 5.03 ng/mL — ABNORMAL HIGH (ref 0.10–4.00)

## 2023-02-22 NOTE — Progress Notes (Signed)
Chief Complaint:  Trevor Potter is a 72 y.o. male who presents today for his annual comprehensive physical exam.    Assessment/Plan:  Chronic Problems Addressed Today: Hyperglycemia Check A1c.   Allergic rhinitis Having worsening symptoms but overall manageable.  On Zyrtec.  Uses Astelin as needed.  Does not need refill today.  He will let us know if symptoms worsen and we can refer to ENT or allergy.  Leg edema No red flags.  Symptoms are currently manageable.  We also discussed conservative measures as we have in the past.  Ulnar neuropathy Currently manageable.  Did discuss referral to sports medicine however he declined.  He will let us know if symptoms change.  Preventative Healthcare: Flu vaccine given today.  Check labs.  Can get other vaccines at the pharmacy.  Patient Counseling(The following topics were reviewed and/or handout was given):  -Nutrition: Stressed importance of moderation in sodium/caffeine intake, saturated fat and cholesterol, caloric balance, sufficient intake of fresh fruits, vegetables, and fiber.  -Stressed the importance of regular exercise.   -Substance Abuse: Discussed cessation/primary prevention of tobacco, alcohol, or other drug use; driving or other dangerous activities under the influence; availability of treatment for abuse.   -Injury prevention: Discussed safety belts, safety helmets, smoke detector, smoking near bedding or upholstery.   -Sexuality: Discussed sexually transmitted diseases, partner selection, use of condoms, avoidance of unintended pregnancy and contraceptive alternatives.   -Dental health: Discussed importance of regular tooth brushing, flossing, and dental visits.  -Health maintenance and immunizations reviewed. Please refer to Health maintenance section.  Return to care in 1 year for next preventative visit.     Subjective:  HPI:  He has no acute complaints today.   Lifestyle Diet: Balanced. Plenty of fruits and  vegetables.  Exercise: None specific.      02/22/2023    9:59 AM  Depression screen PHQ 2/9  Decreased Interest 0  Down, Depressed, Hopeless 0  PHQ - 2 Score 0    Health Maintenance Due  Topic Date Due   Medicare Annual Wellness (AWV)  Never done   DTaP/Tdap/Td (1 - Tdap) Never done     ROS: Per HPI, otherwise a complete review of systems was negative.   PMH:  The following were reviewed and entered/updated in epic: Past Medical History:  Diagnosis Date   Allergy    Cataract    Colon polyp 03/12/2011   Sigmoid polyp   GERD (gastroesophageal reflux disease)    Hiatal hernia    Kidney stones    Ulcer    Patient Active Problem List   Diagnosis Date Noted   Leg edema 02/17/2021   Ulnar neuropathy 02/17/2021   Hyperglycemia 01/21/2020   History of kidney stones 01/21/2020   Allergic rhinitis 01/21/2020   S/P Nissen fundoplication (without gastrostomy tube) procedure 07/18/2012   Diverticulosis of colon without hemorrhage 07/18/2012   Past Surgical History:  Procedure Laterality Date   CERVICAL FUSION     with plates and screws   COLONOSCOPY     EXTRACORPOREAL SHOCK WAVE LITHOTRIPSY Right 05/17/2022   Procedure: RIGHT EXTRACORPOREAL SHOCK WAVE LITHOTRIPSY (ESWL);  Surgeon: Bjorn Pippin, MD;  Location: [facility];  Service: Urology;  Laterality: Right;   LEFT HEART CATH AND CORONARY ANGIOGRAPHY N/A 10/19/2016   Procedure: Left Heart Cath and Coronary Angiography;  Surgeon: Kathleene Hazel, MD;  Location: [facility] INVASIVE CV LAB;  Service: Cardiovascular;  Laterality: N/A;   NISSEN FUNDOPLICATION     ROTATOR CUFF REPAIR Right  UPPER GASTROINTESTINAL ENDOSCOPY      Family History  Problem Relation Age of Onset   Clotting disorder Mother    Varicose Veins Mother    Cirrhosis Father    Heart disease Sister    Cirrhosis Sister    Diabetes Sister    Heart disease Brother    Colon cancer Neg Hx    Colon polyps Neg Hx    Esophageal cancer Neg  Hx    Stomach cancer Neg Hx    Rectal cancer Neg Hx     Medications- reviewed and updated Current Outpatient Medications  Medication Sig Dispense Refill   azelastine (ASTELIN) 0.1 % nasal spray Place 2 sprays into both nostrils 2 (two) times daily. 30 mL 12   cetirizine (ZYRTEC) 10 MG tablet Take 10 mg by mouth daily.     Multiple Vitamin (MULTIVITAMIN) tablet Take 1 tablet by mouth daily as needed.     Saw Palmetto, Serenoa repens, (SAW PALMETTO PO) Take 1 capsule by mouth 2 (two) times daily.     No current facility-administered medications for this visit.    Allergies-reviewed and updated Allergies  Allergen Reactions   Ivp Dye [Iodinated Contrast Media] Nausea And Vomiting   Flomax [Tamsulosin Hcl] Other (See Comments)    Makes patient jump   Aleve [Naproxen Sodium] Itching and Rash    Social History   Socioeconomic History   Marital status: Married    Spouse name: Not on file   Number of children: 3   Years of education: Not on file   Highest education level: Not on file  Occupational History   Occupation: retired  Tobacco Use   Smoking status: Former    Current packs/day: 0.00    Types: Cigarettes    Quit date: 03/01/1989    Years since quitting: 34.0   Smokeless tobacco: Never  Vaping Use   Vaping status: Never Used  Substance and Sexual Activity   Alcohol use: Yes    Comment: 1-2 drinks daily   Drug use: No   Sexual activity: Yes  Other Topics Concern   Not on file  Social History Narrative   Not on file   Social Determinants of Health   Financial Resource Strain: Not on file  Food Insecurity: Not on file  Transportation Needs: Not on file  Physical Activity: Not on file  Stress: Not on file  Social Connections: Not on file        Objective:  Physical Exam: BP 125/80   Pulse (!) 59   Temp 97.8 F (36.6 C) (Temporal)   Ht 5' 11.5" (1.816 m)   Wt 228 lb (103.4 kg)   SpO2 97%   BMI 31.36 kg/m   Body mass index is 31.36 kg/m. Wt  Readings from Last 3 Encounters:  02/22/23 228 lb (103.4 kg)  09/30/22 225 lb 9.6 oz (102.3 kg)  05/17/22 222 lb 4.8 oz (100.8 kg)   Gen: NAD, resting comfortably HEENT: TMs normal bilaterally. OP clear. No thyromegaly noted.  CV: RRR with no murmurs appreciated Pulm: NWOB, CTAB with no crackles, wheezes, or rhonchi GI: Normal bowel sounds present. Soft, Nontender, Nondistended. MSK: no edema, cyanosis, or clubbing noted Skin: warm, dry Neuro: CN2-12 grossly intact. Strength 5/5 in upper and lower extremities. Reflexes symmetric and intact bilaterally.  Psych: Normal affect and thought content     Leiah Giannotti M. Jimmey Ralph, MD 02/22/2023 10:37 AM

## 2023-02-22 NOTE — Assessment & Plan Note (Signed)
No red flags.  Symptoms are currently manageable.  We also discussed conservative measures as we have in the past.

## 2023-02-22 NOTE — Assessment & Plan Note (Signed)
Having worsening symptoms but overall manageable.  On Zyrtec.  Uses Astelin as needed.  Does not need refill today.  He will let us know if symptoms worsen and we can refer to ENT or allergy.

## 2023-02-22 NOTE — Patient Instructions (Signed)
It was very nice to see you today!  We will check blood work today.  Please go to the pharmacy for your vaccines.  Please continue to work on diet and exercise.  Return in about 1 year (around 02/22/2024) for Annual Physical.   Take care, Dr Jimmey Ralph  PLEASE NOTE:  If you had any lab tests, please let us know if you have not heard back within a few days. You may see your results on mychart before we have a chance to review them but we will give you a call once they are reviewed by [place].   If we ordered any referrals today, please let us know if you have not heard from their office within the next week.   If you had any urgent prescriptions sent in today, please check with the pharmacy within an hour of our visit to make sure the prescription was transmitted appropriately.   Please try these tips to maintain a healthy lifestyle:  Eat at least 3 REAL meals and 1-2 snacks per day.  Aim for no more than 5 hours between eating.  If you eat breakfast, please do so within one hour of getting up.   Each meal should contain half fruits/vegetables, one quarter protein, and one quarter carbs (no bigger than a computer mouse)  Cut down on sweet beverages. This includes juice, soda, and sweet tea.   Drink at least 1 glass of water with each meal and aim for at least 8 glasses per day  Exercise at least 150 minutes every week.    Preventive Care 15 Years and Older, Male Preventive care refers to lifestyle choices and visits with your health care provider that can promote health and wellness. Preventive care visits are also called wellness exams. What can I expect for my preventive care visit? Counseling During your preventive care visit, your health care provider may ask about your: Medical history, including: Past medical problems. Family medical history. History of falls. Current health, including: Emotional well-being. Home life and relationship well-being. Sexual activity. Memory and  ability to understand (cognition). Lifestyle, including: Alcohol, nicotine or tobacco, and drug use. Access to firearms. Diet, exercise, and sleep habits. Work and work Astronomer. Sunscreen use. Safety issues such as seatbelt and bike helmet use. Physical exam Your health care provider will check your: Height and weight. These may be used to calculate your BMI (body mass index). BMI is a measurement that tells if you are at a healthy weight. Waist circumference. This measures the distance around your waistline. This measurement also tells if you are at a healthy weight and may help predict your risk of certain diseases, such as type 2 diabetes and high blood pressure. Heart rate and blood pressure. Body temperature. Skin for abnormal spots. What immunizations do I need?  Vaccines are usually given at various ages, according to a schedule. Your health care provider will recommend vaccines for you based on your age, medical history, and lifestyle or other factors, such as travel or where you work. What tests do I need? Screening Your health care provider may recommend screening tests for certain conditions. This may include: Lipid and cholesterol levels. Diabetes screening. This is done by checking your blood sugar (glucose) after you have not eaten for a while (fasting). Hepatitis C test. Hepatitis B test. HIV (human immunodeficiency virus) test. STI (sexually transmitted infection) testing, if you are at risk. Lung cancer screening. Colorectal cancer screening. Prostate cancer screening. Abdominal aortic aneurysm (AAA) screening. You may need  this if you are a current or former smoker. Talk with your health care provider about your test results, treatment options, and if necessary, the need for more tests. Follow these instructions at home: Eating and drinking  Eat a diet that includes fresh fruits and vegetables, whole grains, lean protein, and low-fat dairy products. Limit your  intake of foods with high amounts of sugar, saturated fats, and salt. Take vitamin and mineral supplements as recommended by your health care provider. Do not drink alcohol if your health care provider tells you not to drink. If you drink alcohol: Limit how much you have to 0-2 drinks a day. Know how much alcohol is in your drink. In the [place] one drink equals one 12 oz bottle of beer (355 mL), one 5 oz glass of wine (148 mL), or one 1 oz glass of hard liquor (44 mL). Lifestyle Brush your teeth every morning and night with fluoride toothpaste. Floss one time each day. Exercise for at least 30 minutes 5 or more days each week. Do not use any products that contain nicotine or tobacco. These products include cigarettes, chewing tobacco, and vaping devices, such as e-cigarettes. If you need help quitting, ask your health care provider. Do not use drugs. If you are sexually active, practice safe sex. Use a condom or other form of protection to prevent STIs. Take aspirin only as told by your health care provider. Make sure that you understand how much to take and what form to take. Work with your health care provider to find out whether it is safe and beneficial for you to take aspirin daily. Ask your health care provider if you need to take a cholesterol-lowering medicine (statin). Find healthy ways to manage stress, such as: Meditation, yoga, or listening to music. Journaling. Talking to a trusted person. Spending time with friends and family. Safety Always wear your seat belt while driving or riding in a vehicle. Do not drive: If you have been drinking alcohol. Do not ride with someone who has been drinking. When you are tired or distracted. While texting. If you have been using any mind-altering substances or drugs. Wear a helmet and other protective equipment during sports activities. If you have firearms in your house, make sure you follow all gun safety procedures. Minimize exposure to  UV radiation to reduce your risk of skin cancer. What's next? Visit your health care provider once a year for an annual wellness visit. Ask your health care provider how often you should have your eyes and teeth checked. Stay up to date on all vaccines. This information is not intended to replace advice given to you by your health care provider. Make sure you discuss any questions you have with your health care provider. Document Revised: 11/12/2020 Document Reviewed: 11/12/2020 Elsevier Patient Education  2024 [employer].

## 2023-02-22 NOTE — Assessment & Plan Note (Signed)
Currently manageable.  Did discuss referral to sports medicine however he declined.  He will let us know if symptoms change.

## 2023-02-22 NOTE — Assessment & Plan Note (Signed)
Check A1c 

## 2023-02-23 DIAGNOSIS — H43391 Other vitreous opacities, right eye: Secondary | ICD-10-CM | POA: Diagnosis not present

## 2023-02-24 NOTE — Progress Notes (Signed)
PSA is up slightly compared to last year.  Recommend referral to urology for further evaluation.  The rest of his labs are all stable and we can recheck everything else in a year.

## 2023-02-28 DIAGNOSIS — K080 Exfoliation of teeth due to systemic causes: Secondary | ICD-10-CM | POA: Diagnosis not present

## 2023-03-01 DIAGNOSIS — H43391 Other vitreous opacities, right eye: Secondary | ICD-10-CM | POA: Diagnosis not present

## 2023-03-04 ENCOUNTER — Other Ambulatory Visit: Payer: Self-pay | Admitting: *Deleted

## 2023-03-04 DIAGNOSIS — R972 Elevated prostate specific antigen [PSA]: Secondary | ICD-10-CM

## 2023-03-09 DIAGNOSIS — H43391 Other vitreous opacities, right eye: Secondary | ICD-10-CM | POA: Diagnosis not present

## 2023-03-30 DIAGNOSIS — R972 Elevated prostate specific antigen [PSA]: Secondary | ICD-10-CM | POA: Diagnosis not present

## 2023-04-01 ENCOUNTER — Other Ambulatory Visit: Payer: Self-pay | Admitting: Adult Health

## 2023-04-01 DIAGNOSIS — R972 Elevated prostate specific antigen [PSA]: Secondary | ICD-10-CM

## 2023-04-07 ENCOUNTER — Encounter: Payer: Self-pay | Admitting: Adult Health

## 2023-04-20 DIAGNOSIS — H43391 Other vitreous opacities, right eye: Secondary | ICD-10-CM | POA: Diagnosis not present

## 2023-04-20 DIAGNOSIS — Z09 Encounter for follow-up examination after completed treatment for conditions other than malignant neoplasm: Secondary | ICD-10-CM | POA: Diagnosis not present

## 2023-04-22 ENCOUNTER — Ambulatory Visit
Admission: RE | Admit: 2023-04-22 | Discharge: 2023-04-22 | Disposition: A | Discharge status: Home / Self Care | Payer: Medicare Other | Source: Ambulatory Visit | Attending: Adult Health | Admitting: Adult Health

## 2023-04-22 DIAGNOSIS — R972 Elevated prostate specific antigen [PSA]: Secondary | ICD-10-CM

## 2023-04-22 MED ORDER — GADOPICLENOL 0.5 MMOL/ML IV SOLN
10.0000 mL | Freq: Once | INTRAVENOUS | Status: AC | PRN
  Administered 2023-04-22: 10 mL via INTRAVENOUS

## 2023-05-05 DIAGNOSIS — N401 Enlarged prostate with lower urinary tract symptoms: Secondary | ICD-10-CM | POA: Diagnosis not present

## 2023-05-12 DIAGNOSIS — N200 Calculus of kidney: Secondary | ICD-10-CM | POA: Diagnosis not present

## 2023-05-12 DIAGNOSIS — N400 Enlarged prostate without lower urinary tract symptoms: Secondary | ICD-10-CM | POA: Diagnosis not present

## 2023-05-12 DIAGNOSIS — R972 Elevated prostate specific antigen [PSA]: Secondary | ICD-10-CM | POA: Diagnosis not present

## 2024-02-14 DIAGNOSIS — R1084 Generalized abdominal pain: Secondary | ICD-10-CM | POA: Diagnosis not present

## 2024-02-14 DIAGNOSIS — N201 Calculus of ureter: Secondary | ICD-10-CM | POA: Diagnosis not present

## 2024-02-21 DIAGNOSIS — N201 Calculus of ureter: Secondary | ICD-10-CM | POA: Diagnosis not present

## 2024-02-21 DIAGNOSIS — R3121 Asymptomatic microscopic hematuria: Secondary | ICD-10-CM | POA: Diagnosis not present

## 2024-02-22 ENCOUNTER — Other Ambulatory Visit: Payer: Self-pay | Admitting: Urology

## 2024-02-23 ENCOUNTER — Encounter: Payer: Self-pay | Admitting: Family Medicine

## 2024-02-23 ENCOUNTER — Ambulatory Visit (INDEPENDENT_AMBULATORY_CARE_PROVIDER_SITE_OTHER): Payer: Medicare Other | Admitting: Family Medicine

## 2024-02-23 VITALS — BP 123/71 | HR 67 | Temp 98.3°F | Ht 71.5 in | Wt 214.2 lb

## 2024-02-23 DIAGNOSIS — Z23 Encounter for immunization: Secondary | ICD-10-CM

## 2024-02-23 DIAGNOSIS — M25512 Pain in left shoulder: Secondary | ICD-10-CM

## 2024-02-23 DIAGNOSIS — G8929 Other chronic pain: Secondary | ICD-10-CM

## 2024-02-23 DIAGNOSIS — Z87442 Personal history of urinary calculi: Secondary | ICD-10-CM

## 2024-02-23 DIAGNOSIS — Z0001 Encounter for general adult medical examination with abnormal findings: Secondary | ICD-10-CM

## 2024-02-23 DIAGNOSIS — R739 Hyperglycemia, unspecified: Secondary | ICD-10-CM

## 2024-02-23 DIAGNOSIS — Z13220 Encounter for screening for lipoid disorders: Secondary | ICD-10-CM

## 2024-02-23 LAB — COMPREHENSIVE METABOLIC PANEL WITH GFR
ALT: 26 U/L (ref 0–53)
AST: 22 U/L (ref 0–37)
Albumin: 4 g/dL (ref 3.5–5.2)
Alkaline Phosphatase: 100 U/L (ref 39–117)
BUN: 19 mg/dL (ref 6–23)
CO2: 25 meq/L (ref 19–32)
Calcium: 9.1 mg/dL (ref 8.4–10.5)
Chloride: 106 meq/L (ref 96–112)
Creatinine, Ser: 1.33 mg/dL (ref 0.40–1.50)
GFR: 53.51 mL/min — ABNORMAL LOW (ref >60.00)
Glucose, Bld: 87 mg/dL (ref 70–99)
Potassium: 4.3 meq/L (ref 3.5–5.1)
Sodium: 141 meq/L (ref 135–145)
Total Bilirubin: 0.9 mg/dL (ref 0.2–1.2)
Total Protein: 5.9 g/dL — ABNORMAL LOW (ref 6.0–8.3)

## 2024-02-23 LAB — LIPID PANEL
Cholesterol: 151 mg/dL (ref 0–200)
HDL: 46.2 mg/dL (ref >39.00)
LDL Cholesterol: 83 mg/dL (ref 0–99)
NonHDL: 104.63
Total CHOL/HDL Ratio: 3
Triglycerides: 107 mg/dL (ref 0.0–149.0)
VLDL: 21.4 mg/dL (ref 0.0–40.0)

## 2024-02-23 LAB — CBC
HCT: 41.2 % (ref 39.0–52.0)
Hemoglobin: 14.1 g/dL (ref 13.0–17.0)
MCHC: 34.3 g/dL (ref 30.0–36.0)
MCV: 94.3 fl (ref 78.0–100.0)
Platelets: 242 K/uL (ref 150.0–400.0)
RBC: 4.37 Mil/uL (ref 4.22–5.81)
RDW: 13.2 % (ref 11.5–15.5)
WBC: 5.6 K/uL (ref 4.0–10.5)

## 2024-02-23 LAB — TSH: TSH: 1.6 u[IU]/mL (ref 0.35–5.50)

## 2024-02-23 LAB — HEMOGLOBIN A1C: Hgb A1c MFr Bld: 5.3 % (ref 4.6–6.5)

## 2024-02-23 NOTE — Assessment & Plan Note (Signed)
 Has upcoming cystoscopy and potentially stent placement for retained kidney stones.

## 2024-02-23 NOTE — Progress Notes (Signed)
 Chief Complaint:  Trevor Potter is a 73 y.o. male who presents today for his annual comprehensive physical exam.    Assessment/Plan:  New/Acute Problems: Left Shoulder Pain  This has been going on for 11 months.  Concern for potential rotator cuff tear.  May have biceps tendon injury as well.  Will place referral to orthopedics.  Chronic Problems Addressed Today: Hyperglycemia Check A1c with labs.  History of kidney stones Has upcoming cystoscopy and potentially stent placement for retained kidney stones.  Preventative Healthcare: Check labs.  Flu vaccine given today.  Patient Counseling(The following topics were reviewed and/or handout was given):  -Nutrition: Stressed importance of moderation in sodium/caffeine intake, saturated fat and cholesterol, caloric balance, sufficient intake of fresh fruits, vegetables, and fiber.  -Stressed the importance of regular exercise.   -Substance Abuse: Discussed cessation/primary prevention of tobacco, alcohol, or other drug use; driving or other dangerous activities under the influence; availability of treatment for abuse.   -Injury prevention: Discussed safety belts, safety helmets, smoke detector, smoking near bedding or upholstery.   -Sexuality: Discussed sexually transmitted diseases, partner selection, use of condoms, avoidance of unintended pregnancy and contraceptive alternatives.   -Dental health: Discussed importance of regular tooth brushing, flossing, and dental visits.  -Health maintenance and immunizations reviewed. Please refer to Health maintenance section.  Return to care in 1 year for next preventative visit.     Subjective:  HPI:  He has no acute complaints today. Patient is here today for his  annual physical.  See assessment / plan for status of chronic conditions.  Discussed the use of AI scribe software for clinical note transcription with the patient, who gave verbal consent to proceed.  History of Present  Illness Trevor Potter is a 73 year old male with recurrent kidney stones who presents for an annual physical exam.  He has three kidney stones in his left kidney, measuring 3.5 mm, 5.5 mm, and 7.5 mm, approximately one inch apart. He underwent lithotripsy about a year and a half ago for a 9 mm stone, which was fragmented and took a long time to pass. Recently, he experienced increased pain, and imaging confirmed the presence of three stones. He has a long history of kidney stones, starting around age 35, with occurrences every 1.5 to 3 years. The stones are described as having a dark brown center with crystal-like spikes, but analysis has shown no unusual findings. He is not on specific medication for the stones.  He reports shoulder pain that began approximately 11 months ago after altering his golf swing. The pain occurs when reaching above shoulder height or lifting heavy objects, with a sensation of tearing and popping. He has not sought medical evaluation, hoping it would resolve on its own. He previously had rotator cuff surgery on the opposite shoulder over 15 years ago.  He maintains a stable weight between 210 and 220 pounds and engages in regular physical activity through yard work. He reports no significant changes in diet or exercise habits. He has a history of neck problems that prevent him from riding roller coasters and motorcycles.     Lifestyle Diet: Balanced. Plenty of fruits and vegetables.  Exercise: None specific. Does a lot of yard work.      02/23/2024    9:57 AM  Depression screen PHQ 2/9  Decreased Interest 0  Down, Depressed, Hopeless 0  PHQ - 2 Score 0    Health Maintenance Due  Topic Date Due   Medicare Annual Wellness (AWV)  Never done     ROS: Per HPI, otherwise a complete review of systems was negative.   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 tube) procedure 07/18/2012   Diverticulosis of colon without hemorrhage 07/18/2012   Past Surgical History:  Procedure Laterality Date   CERVICAL FUSION     with plates and screws   COLONOSCOPY     EXTRACORPOREAL SHOCK WAVE LITHOTRIPSY Right 05/17/2022   Procedure: RIGHT EXTRACORPOREAL SHOCK WAVE LITHOTRIPSY (ESWL);  Surgeon: Trevor Rush, MD;  Location: [facility];  Service: Urology;  Laterality: Right;   LEFT HEART CATH AND CORONARY ANGIOGRAPHY N/A 10/19/2016   Procedure: Left Heart Cath and Coronary Angiography;  Surgeon: Trevor Lonni BIRCH, MD;  Location: [facility] INVASIVE CV LAB;  Service: Cardiovascular;  Laterality: N/A;   NISSEN FUNDOPLICATION     ROTATOR CUFF REPAIR Right    UPPER GASTROINTESTINAL ENDOSCOPY      Family History  Problem Relation Age of Onset   Clotting disorder Mother    Varicose Veins Mother    Cirrhosis Father    Heart disease Sister    Cirrhosis Sister    Diabetes Sister    Heart disease Brother    Colon cancer Neg Hx    Colon polyps Neg Hx    Esophageal cancer Neg Hx    Stomach cancer Neg Hx    Rectal cancer Neg Hx     Medications- reviewed and updated Current Outpatient Medications  Medication Sig Dispense Refill   azelastine  (ASTELIN ) 0.1 % nasal spray Place 2 sprays into both nostrils 2 (two) times daily. 30 mL 12   cetirizine (ZYRTEC) 10 MG tablet Take 10 mg by mouth daily.     Multiple Vitamin (MULTIVITAMIN) tablet Take 1 tablet by mouth daily as needed.     Saw Palmetto, Serenoa repens, (SAW PALMETTO PO) Take 1 capsule by mouth 2 (two) times daily.     No  current facility-administered medications for this visit.    Allergies-reviewed and updated Allergies  Allergen Reactions   Ivp Dye [Iodinated Contrast Media] Nausea And Vomiting   Flomax  [Tamsulosin  Hcl] Other (See Comments)    Makes patient jump   Aleve [Naproxen Sodium] Itching and Rash    Social History   Socioeconomic History   Marital status: Married    Spouse name: Not on file   Number of children: 3   Years of education: Not on file   Highest education level: Not on file  Occupational History   Occupation: retired  Tobacco Use   Smoking status: Former    Current packs/day: 0.00    Types: Cigarettes    Quit date: 03/01/1989    Years since quitting: 35.0   Smokeless tobacco: Never  Vaping Use   Vaping status: Never Used  Substance and Sexual Activity   Alcohol use: Yes    Comment: 1-2 drinks daily   Drug use: No   Sexual activity: Yes  Other Topics Concern  Not on file  Social History Narrative   Not on file   Social Drivers of Health   Financial Resource Strain: Not on file  Food Insecurity: Not on file  Transportation Needs: Not on file  Physical Activity: Not on file  Stress: Not on file  Social Connections: Not on file        Objective:  Physical Exam: BP 123/71   Pulse 67   Temp 98.3 F (36.8 C) (Temporal)   Ht 5' 11.5 (1.816 m)   Wt 214 lb 3.2 oz (97.2 kg)   SpO2 98%   BMI 29.46 kg/m   Body mass index is 29.46 kg/m. Wt Readings from Last 3 Encounters:  02/23/24 214 lb 3.2 oz (97.2 kg)  02/22/23 228 lb (103.4 kg)  09/30/22 225 lb 9.6 oz (102.3 kg)   Gen: NAD, resting comfortably HEENT: TMs normal bilaterally. OP clear. No thyromegaly noted.  CV: RRR with no murmurs appreciated Pulm: NWOB, CTAB with no crackles, wheezes, or rhonchi GI: Normal bowel sounds present. Soft, Nontender, Nondistended. MSK: no edema, cyanosis, or clubbing noted - Left Shoulder: No deformities.  Tender to palpation along anterior edge of left shoulder.   Pain elicited with resisted shoulder flexion. Skin: warm, dry Neuro: CN2-12 grossly intact. Strength 5/5 in upper and lower extremities. Reflexes symmetric and intact bilaterally.  Psych: Normal affect and thought content     Cordell Guercio M. Kennyth, MD 02/23/2024 10:36 AM

## 2024-02-23 NOTE — Addendum Note (Signed)
 Addended by: Cori Henningsen M on: 02/23/2024 11:05 AM   Modules accepted: Orders

## 2024-02-23 NOTE — Patient Instructions (Signed)
 It was very nice to see you today!  VISIT SUMMARY: You came in for your annual physical exam and discussed your recurrent kidney stones and shoulder pain.  YOUR PLAN: MULTIPLE LEFT KIDNEY STONES: You have three kidney stones in your left kidney that are too far apart for lithotripsy. -Surgical removal is planned for March 06, 2024. -Complete pre-operative testing on February 29, 2024.  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 -You will be referred to orthopedics for evaluation and management.  GENERAL HEALTH MAINTENANCE: We discussed your vaccinations and physical activity. -Administer the flu vaccine today. -Check blood work for A1c, cholesterol, and kidney function. -Obtain vaccination records for shingles and COVID-19. -Encourage physical activity and cognitive engagement.  Return in about 1 year (around 02/22/2025) for Annual Physical.   Take care, Dr Kennyth  PLEASE NOTE:  If you had any lab tests, please let us  know if you have not heard back within a few days. You may see your results on mychart before we have a chance to review them but we will give you a call once they are reviewed by us .   If we ordered any referrals today, please let us  know if you have not heard from their office within the next week.   If you had any urgent prescriptions sent in today, please check with the pharmacy within an hour of our visit to make sure the prescription was transmitted appropriately.   Please try these tips to maintain a healthy lifestyle:  Eat at least 3 REAL meals and 1-2 snacks per day.  Aim for no more than 5 hours between eating.  If you eat breakfast, please do so within one hour of getting up.   Each meal should contain half fruits/vegetables, one quarter protein, and one quarter carbs (no bigger than a computer mouse)  Cut down on sweet  beverages. This includes juice, soda, and sweet tea.   Drink at least 1 glass of water with each meal and aim for at least 8 glasses per day  Exercise at least 150 minutes every week.    Preventive Care 21 Years and Older, Male Preventive care refers to lifestyle choices and visits with your health care provider that can promote health and wellness. Preventive care visits are also called wellness exams. What can I expect for my preventive care visit? Counseling During your preventive care visit, your health care provider may ask about your: Medical history, including: Past medical problems. Family medical history. History of falls. Current health, including: Emotional well-being. Home life and relationship well-being. Sexual activity. Memory and ability to understand (cognition). Lifestyle, including: Alcohol, nicotine or tobacco, and drug use. Access to firearms. Diet, exercise, and sleep habits. Work and work Astronomer. Sunscreen use. Safety issues such as seatbelt and bike helmet use. Physical exam Your health care provider will check your: Height and weight. These may be used to calculate your BMI (body mass index). BMI is a measurement that tells if you are at a healthy weight. Waist circumference. This measures the distance around your waistline. This measurement also tells if you are at a healthy weight and may help predict your risk of certain diseases, such as type 2 diabetes and high blood pressure. Heart rate and blood pressure. Body temperature. Skin for abnormal spots. What immunizations do I need?  Vaccines are usually given at various ages, according  to a schedule. Your health care provider will recommend vaccines for you based on your age, medical history, and lifestyle or other factors, such as travel or where you work. What tests do I need? Screening Your health care provider may recommend screening tests for certain conditions. This may include: Lipid and  cholesterol levels. Diabetes screening. This is done by checking your blood sugar (glucose) after you have not eaten for a while (fasting). Hepatitis C test. Hepatitis B test. HIV (human immunodeficiency virus) test. STI (sexually transmitted infection) testing, if you are at risk. Lung cancer screening. Colorectal cancer screening. Prostate cancer screening. Abdominal aortic aneurysm (AAA) screening. You may need this if you are a current or former smoker. Talk with your health care provider about your test results, treatment options, and if necessary, the need for more tests. Follow these instructions at home: Eating and drinking  Eat a diet that includes fresh fruits and vegetables, whole grains, lean protein, and low-fat dairy products. Limit your intake of foods with high amounts of sugar, saturated fats, and salt. Take vitamin and mineral supplements as recommended by your health care provider. Do not drink alcohol if your health care provider tells you not to drink. If you drink alcohol: Limit how much you have to 0-2 drinks a day. Know how much alcohol is in your drink. In the [place] one drink equals one 12 oz bottle of beer (355 mL), one 5 oz glass of wine (148 mL), or one 1 oz glass of hard liquor (44 mL). Lifestyle Brush your teeth every morning and night with fluoride toothpaste. Floss one time each day. Exercise for at least 30 minutes 5 or more days each week. Do not use any products that contain nicotine or tobacco. These products include cigarettes, chewing tobacco, and vaping devices, such as e-cigarettes. If you need help quitting, ask your health care provider. Do not use drugs. If you are sexually active, practice safe sex. Use a condom or other form of protection to prevent STIs. Take aspirin  only as told by your health care provider. Make sure that you understand how much to take and what form to take. Work with your health care provider to find out whether it is safe  and beneficial for you to take aspirin  daily. Ask your health care provider if you need to take a cholesterol-lowering medicine (statin). Find healthy ways to manage stress, such as: Meditation, yoga, or listening to music. Journaling. Talking to a trusted person. Spending time with friends and family. Safety Always wear your seat belt while driving or riding in a vehicle. Do not drive: If you have been drinking alcohol. Do not ride with someone who has been drinking. When you are tired or distracted. While texting. If you have been using any mind-altering substances or drugs. Wear a helmet and other protective equipment during sports activities. If you have firearms in your house, make sure you follow all gun safety procedures. Minimize exposure to UV radiation to reduce your risk of skin cancer. What's next? Visit your health care provider once a year for an annual wellness visit. Ask your health care provider how often you should have your eyes and teeth checked. Stay up to date on all vaccines. This information is not intended to replace advice given to you by your health care provider. Make sure you discuss any questions you have with your health care provider. Document Revised: 11/12/2020 Document Reviewed: 11/12/2020 Elsevier Patient Education  2024 [employer].

## 2024-02-23 NOTE — Assessment & Plan Note (Signed)
Check A1c with labs. 

## 2024-02-24 ENCOUNTER — Ambulatory Visit: Payer: Self-pay | Admitting: Family Medicine

## 2024-02-24 NOTE — Progress Notes (Signed)
 Labs are all stable.  He should keep up the great work and we can recheck everything in a year.

## 2024-02-28 NOTE — Progress Notes (Signed)
 COVID Vaccine Completed: yes  Date of COVID positive in last 90 days:  PCP - Worth Kitty, MD Cardiologist - n/a  Chest x-ray - N/A EKG - N/A Stress Test - N/A ECHO - N/A Cardiac Cath - 10/19/16 Epic Pacemaker/ICD device last checked:N/A Spinal Cord Stimulator:N/A  Bowel Prep - N/A  Sleep Study - N/A CPAP -   Fasting Blood Sugar - N/A Checks Blood Sugar _____ times a day  Last dose of GLP1 agonist-  N/A GLP1 instructions:  Do not take after     Last dose of SGLT-2 inhibitors-  N/A SGLT-2 instructions:  Do not take after     Blood Thinner Instructions: N/A Last dose:   Time: Aspirin  Instructions:N/A Last Dose:  Activity level: Can go up a flight of stairs and perform activities of daily living without stopping and without symptoms of chest pain or shortness of breath.   Anesthesia review: N/A  Patient denies shortness of breath, fever, cough and chest pain at PAT appointment  Patient verbalized understanding of instructions that were given to them at the PAT appointment. Patient was also instructed that they will need to review over the PAT instructions again at home before surgery.

## 2024-02-28 NOTE — Patient Instructions (Signed)
 SURGICAL WAITING ROOM VISITATION  Patients having surgery or a procedure may have no more than 2 support people in the waiting area - these visitors may rotate.    Children under the age of 38 must have an adult with them who is not the patient.  Visitors with respiratory illnesses are discouraged from visiting and should remain at home.  If the patient needs to stay at the hospital during part of their recovery, the visitor guidelines for inpatient rooms apply. Pre-op  nurse will coordinate an appropriate time for 1 support person to accompany patient in pre-op .  This support person may not rotate.    Please refer to the [facility] website for the visitor guidelines for Inpatients (after your surgery is over and you are in a regular room).    Your procedure is scheduled on: 03/06/24   Report to [facility] Main Entrance    Report to admitting at 1:00 PM   Call this number if you have problems the morning of surgery [phone]   Do not eat food or drink liquids :After Midnight.          If you have questions, please contact your surgeon's office.   FOLLOW BOWEL PREP AND ANY ADDITIONAL PRE OP INSTRUCTIONS YOU RECEIVED FROM YOUR SURGEON'S OFFICE!!!     Oral Hygiene is also important to reduce your risk of infection.                                    Remember - BRUSH YOUR TEETH THE MORNING OF SURGERY WITH YOUR REGULAR TOOTHPASTE  DENTURES WILL BE REMOVED PRIOR TO SURGERY PLEASE DO NOT APPLY Poly grip OR ADHESIVES!!!   Stop all vitamins and herbal supplements 7 days before surgery.   Take these medicines the morning of surgery with A SIP OF WATER: Zyrtec                              You may not have any metal on your body including jewelry, and body piercing             Do not wear lotions, powders, cologne, or deodorant              Men may shave face and neck.   Do not bring valuables to the hospital. [place] IS NOT             RESPONSIBLE   FOR  VALUABLES.   Contacts, glasses, dentures or bridgework may not be worn into surgery.  DO NOT BRING YOUR HOME MEDICATIONS TO THE HOSPITAL. PHARMACY WILL DISPENSE MEDICATIONS LISTED ON YOUR MEDICATION LIST TO YOU DURING YOUR ADMISSION IN THE HOSPITAL!    Patients discharged on the day of surgery will not be allowed to drive home.  Someone NEEDS to stay with you for the first 24 hours after anesthesia.              Please read over the following fact sheets you were given: IF YOU HAVE QUESTIONS ABOUT YOUR PRE-OP  INSTRUCTIONS PLEASE CALL [phone].   If you received a COVID test during your pre-op  visit  it is requested that you wear a mask when out in public, stay away from anyone that may not be feeling well and notify your surgeon if you develop symptoms. If you test positive for Covid or have been in contact  with anyone that has tested positive in the last 10 days please notify you surgeon.    [facility] - Preparing for Surgery Before surgery, you can play an important role.  Because skin is not sterile, your skin needs to be as free of germs as possible.  You can reduce the number of germs on your skin by washing with CHG (chlorahexidine gluconate) soap before surgery.  CHG is an antiseptic cleaner which kills germs and bonds with the skin to continue killing germs even after washing. Please DO NOT use if you have an allergy to CHG or antibacterial soaps.  If your skin becomes reddened/irritated stop using the CHG and inform your nurse when you arrive at Short Stay. Do not shave (including legs and underarms) for at least 48 hours prior to the first CHG shower.  You may shave your face/neck.  Please follow these instructions carefully:  1.  Shower with CHG Soap the night before surgery and the  morning of surgery.  2.  If you choose to wash your hair, wash your hair first as usual with your normal  shampoo.  3.  After you shampoo, rinse your hair and body thoroughly to remove the  shampoo.                             4.  Use CHG as you would any other liquid soap.  You can apply chg directly to the skin and wash.  Gently with a scrungie or clean washcloth.  5.  Apply the CHG Soap to your body ONLY FROM THE NECK DOWN.   Do   not use on face/ open                           Wound or open sores. Avoid contact with eyes, ears mouth and   genitals (private parts).                       Wash face,  Genitals (private parts) with your normal soap.             6.  Wash thoroughly, paying special attention to the area where your    surgery  will be performed.  7.  Thoroughly rinse your body with warm water from the neck down.  8.  DO NOT shower/wash with your normal soap after using and rinsing off the CHG Soap.                9.  Pat yourself dry with a clean towel.            10.  Wear clean pajamas.            11.  Place clean sheets on your bed the night of your first shower and do not  sleep with pets. Day of Surgery : Do not apply any lotions/deodorants the morning of surgery.  Please wear clean clothes to the hospital/surgery center.  FAILURE TO FOLLOW THESE INSTRUCTIONS MAY RESULT IN THE CANCELLATION OF YOUR SURGERY  PATIENT SIGNATURE_________________________________  NURSE SIGNATURE__________________________________  ________________________________________________________________________

## 2024-02-29 ENCOUNTER — Encounter (HOSPITAL_COMMUNITY)
Admission: RE | Admit: 2024-02-29 | Discharge: 2024-02-29 | Disposition: A | Discharge status: Home / Self Care | Source: Ambulatory Visit | Attending: Urology | Admitting: Urology

## 2024-02-29 ENCOUNTER — Encounter (HOSPITAL_COMMUNITY): Payer: Self-pay

## 2024-02-29 ENCOUNTER — Other Ambulatory Visit: Payer: Self-pay

## 2024-02-29 DIAGNOSIS — Z01818 Encounter for other preprocedural examination: Secondary | ICD-10-CM | POA: Diagnosis not present

## 2024-03-05 NOTE — H&P (Signed)
 Office Visit Report     02/21/2024   --------------------------------------------------------------------------------   Trevor Potter  MRN: [id number]  DOB: [birth year], 73 year old Male  SSN: [id number]   PRIMARY CARE:  Worth Kitty, MD  PRIMARY CARE FAX:  [phone]  REFERRING:  Donnice FABIENE Brooks, MD  PROVIDER:  Ubaldo Eagles, NP  LOCATION:  [facility] [phone]     --------------------------------------------------------------------------------   CC/HPI:   02/14/2024: Seen acutely today for evaluation of possible kidney stone event. Presented again yesterday with sudden onset of left lower back and flank pain/discomfort. This has been constant, moderately severe in nature. Somewhat manageable with alternating of Tylenol  and ibuprofen. He is also complaining of increased urinary urgency and weak/intermittent stream. Also complaining of discolored, dark brown urine. He denies dysuria. He has not seen any interval stone material passage. Current symptoms associated with nausea but no vomiting. He denies fevers or chills.   02/21/2024: Urines for repeat evaluation. KUB showed a likely left ureteral calculi but I had suspicion for possible to distal ureteral calculi as well. He was prescribed pain medication, he has an allergy to Flomax . Today he continues to have intermittent symptoms mostly at night but typically will alleviate with use of a heating pad and some ibuprofen. He has continued microscopic hematuria on UA. Also having some mild increase in frequency and urgency but this is manageable. He denies dysuria. He is not seeing true gross hematuria, just dark urine. Denies fever/chills and nausea/vomiting.     ALLERGIES: Aleve TABS Contrast Dye Penicillins Tamsulosin     MEDICATIONS: Zyrtec  HYDROcodone-Acetaminophen  5-325 MG Tablet 1 tablet PO Q 6 H PRN Take for severe stone pain (n20.1)  Multiple Vitamin  Saw Palmetto     GU PSH: ESWL -  05/17/2022 Excision Bilateral Hydroceles - 2009 Removal of spermatocele - 2009       PSH Notes: Neck Surgery, Surgery Tunica Vaginalis Excision Of Hydrocele Bilateral, Surgery Excision Of Spermatocele W/ Epididymectomy Bilateral, Gastric Surgery   NON-GU PSH: Visit Complexity (formerly GPC1X) - 02/14/2024, 05/12/2023     GU PMH: Flank Pain - 02/14/2024 Ureteral calculus (Stable) - 02/14/2024, - 2024 (Stable), - 05/06/2022, - 2021, - 2021, - 2021 BPH w/o LUTS - 05/12/2023, - 04/08/2022 (Stable), His prostate may have increased in size just slightly. His PSA remains low and stable. We discussed continued monitoring., - 2018 Elevated PSA, PSAD was normal, pMRI normal. PSA normalized. - 05/12/2023, - 03/30/2023 Renal calculus - 05/12/2023, KUB with stone not seen on the right. He hasn't had flank pain or stone passage. Given MH with get CT and cysto. , - 04/08/2022, Disc KUB and stable. , - 2022 (Stable), check KUB , - 2022, Bilateral, - 2021, Bilateral, He has a right renal calculus that remained stable. He has passed the left ureteral stone. He didn't bring it in with him but I'm quite sure that's what occurred., - 2018, Bilateral kidney stones, - 2014 BPH w/LUTS - 03/30/2023, - 2022, DRE benign - 50 g. PSA was sent, - 2022, Benign prostatic hyperplasia with urinary obstruction, - 2016 Microscopic hematuria - 04/29/2022, - 04/08/2022 Spermatocele of epididymis, Unspec, Disc nature r/b/a to spermatocelectomy and he will cont surveillance. - 2022, check scrotal US  , - 2022, Spermatocele, - 2014 Weak Urinary Stream (Stable) - 2022, - 2022, - 2021, PSA sent , - 2021 RLQ pain, Right, He has tenderness to palpation in the musculature of his lower abdomen. We discussed the fact that he has some form  of muscular injury whether that is a tear or bruise I told him to use nonsteroidal anti-inflammatory medication and a heating pad. 2018-04-02 Encounter for Prostate Cancer screening (Stable), His PSA is currently 2.39.  This is up slightly from where it was 2 years ago consistent with some slight increase in size of his prostate secondary to BPH. 04-02-17, Prostate cancer screening, - 04/03/15 Hematospermia, Hematospermia - 2014 Hydrocele, Unspec, Hydrocele, right - 2013-04-02, Testicular Hydrocele, - 2014 Unil Inguinal Hernia W/O obst or gang,non-recurrent, Inguinal hernia, right - 2014 Varicocele, Varicocele - 2013-04-02      PMH Notes: He has a history of bilateral hydroceles/spermatoceles and underwent bilateral hydrocelectomy/spermatocelectomy in 9/09. At that time there was some hydrocele fluid around the testicles but I did not remove the parietal tunica vaginalis. I mainly performed spermatocelectomy's and replaced the testicle back in the tunica and closed it. He was then seen in 10/12 for a right hydrocele noted formed but was not causing any symptoms. We discussed surgical therapy and he elected to not proceed with surgery at that time.   Nephrolithiasis: CT scan 12/12 revealed 2 small stones in the lower pole of the right kidney as well. There is a small stone seen in the mid to lower pole of the left kidney.   Right renal cyst: Incidentally noted on CT scan 12/12 was a simple cyst in the lower pole of his right kidney.     NON-GU PMH: Encounter for general adult medical examination without abnormal findings, Encounter for preventive health examination - 04-03-15 Personal history of other diseases of the digestive system, History of esophageal reflux - 04/02/13, History of hiatal hernia, - 2014 Personal history of other infectious and parasitic diseases, History of condyloma acuminatum - 04/02/2013    FAMILY HISTORY: Death In The Family Father - Runs In Family Family Health Status Number - Runs In Family hepatic failure - Father   SOCIAL HISTORY: Marital Status: Married Preferred Language: English; Ethnicity: Not Hispanic Or Latino; Race: White Current Smoking Status: Patient does not smoke anymore. Has not smoked since  10/30/1991.   Tobacco Use Assessment Completed: Used Tobacco in last 30 days? Does drink.  Drinks 2 caffeinated drinks per day.     Notes: Former smoker, Occupation:, Tobacco Use, Caffeine Use, Alcohol Use, Marital History - Currently Married   REVIEW OF SYSTEMS:    GU Review Male:   Patient reports frequent urination, hard to postpone urination, and get up at night to urinate. Patient denies burning/ pain with urination, leakage of urine, stream starts and stops, trouble starting your stream, have to strain to urinate , erection problems, and penile pain.  Gastrointestinal (Upper):   Patient denies nausea, vomiting, and indigestion/ heartburn.  Gastrointestinal (Lower):   Patient denies diarrhea and constipation.  Constitutional:   Patient denies fever, night sweats, weight loss, and fatigue.  Skin:   Patient denies skin rash/ lesion and itching.  Eyes:   Patient denies blurred vision and double vision.  Ears/ Nose/ Throat:   Patient denies sore throat and sinus problems.  Hematologic/Lymphatic:   Patient denies swollen glands and easy bruising.  Cardiovascular:   Patient denies leg swelling and chest pains.  Respiratory:   Patient denies cough and shortness of breath.  Endocrine:   Patient denies excessive thirst.  Musculoskeletal:   left kidney pain .  Patient reports back pain. Patient denies joint pain.  Neurological:   Patient denies headaches and dizziness.  Psychologic:   Patient denies depression and anxiety.  VITAL SIGNS: None   MULTI-SYSTEM PHYSICAL EXAMINATION:    Constitutional: Well-nourished. No physical deformities. Normally developed. Good grooming.  Neck: Neck symmetrical, not swollen. Normal tracheal position.  Respiratory: No labored breathing, no use of accessory muscles.   Cardiovascular: Normal temperature, normal extremity pulses, no swelling, no varicosities.  Skin: No paleness, no jaundice, no cyanosis. No lesion, no ulcer, no rash.  Neurologic / Psychiatric:  Oriented to time, oriented to place, oriented to person. No depression, no anxiety, no agitation.  Gastrointestinal: No mass, no tenderness, no rigidity, non obese abdomen.  Musculoskeletal: Normal gait and station of head and neck.     Complexity of Data:  Source Of History:  Patient, Medical Record Summary  Records Review:   Previous Doctor Records, Previous Hospital Records, Previous Patient Records  Urine Test Review:   Urinalysis, Urine Culture  X-Ray Review: KUB: Reviewed Films.  C.T. Stone Protocol: Reviewed Films. Discussed With Patient.     05/05/23 03/30/23 04/08/22 02/05/21 01/22/20 08/23/16 08/26/14 08/29/13  PSA  Total PSA 3.61 ng/mL 4.31 ng/mL 3.58 ng/mL 3.28 ng/mL 3.93 ng/mL 2.39 ng/dl 7.93  8.22   Free PSA  1.11 ng/mL        % Free PSA  26 % PSA          02/21/24  Urinalysis  Urine Appearance Clear   Urine Color Yellow   Urine Glucose Neg mg/dL  Urine Bilirubin Neg mg/dL  Urine Ketones Neg mg/dL  Urine Specific Gravity 1.025   Urine Blood Trace ery/uL  Urine pH 5.5   Urine Protein Trace mg/dL  Urine Urobilinogen 0.2 mg/dL  Urine Nitrites Neg   Urine Leukocyte Esterase Neg leu/uL  Urine WBC/hpf 0 - 5/hpf   Urine RBC/hpf 3 - 10/hpf   Urine Epithelial Cells NS (Not Seen)   Urine Bacteria Rare (0-9/hpf)   Urine Mucous Not Present   Urine Yeast NS (Not Seen)   Urine Trichomonas Not Present   Urine Cystals NS (Not Seen)   Urine Casts NS (Not Seen)   Urine Sperm Not Present    PROCEDURES:         C.T. Urogram - [id number]             Urinalysis w/Scope Dipstick Dipstick Cont'd Micro  Color: Yellow Bilirubin: Neg mg/dL WBC/hpf: 0 - 5/hpf  Appearance: Clear Ketones: Neg mg/dL RBC/hpf: 3 - 89/yeq  Specific Gravity: 1.025 Blood: Trace ery/uL Bacteria: Rare (0-9/hpf)  pH: 5.5 Protein: Trace mg/dL Cystals: NS (Not Seen)  Glucose: Neg mg/dL Urobilinogen: 0.2 mg/dL Casts: NS (Not Seen)    Nitrites: Neg Trichomonas: Not Present    Leukocyte Esterase: Neg leu/uL  Mucous: Not Present      Epithelial Cells: NS (Not Seen)      Yeast: NS (Not Seen)      Sperm: Not Present    ASSESSMENT:      ICD-10 Details  1 GU:   Ureteral calculus - N20.1 Left, Acute, Complicated Injury  2   Microscopic hematuria - R31.21 Undiagnosed New Problem   PLAN:                 Schedule Return Visit/Planned Activity: Next Available Appointment - Schedule Surgery, Follow up MD          Document Letter(s):  Created for Patient: Clinical Summary         Notes:   CT imaging revealed obstructing 4 mm distal left ureteral calculi with an additional 3.5 mm and 7 mm stone superior to  the most distal stone. Large stone identified along the left mid ureter. Recommended getting him set up for definitive intervention. Due to the number of stones in the ureter, ureteroscopy was recommended. I will send a message to Dr. Nieves to confirm.   For ureteroscopy I described the risks which include heart attack, stroke, pulmonary embolus, death, bleeding, infection, damage to contiguous structures, positioning injury, ureteral stricture, ureteral avulsion, ureteral injury, need for ureteral stent, inability to perform ureteroscopy, need for an interval procedure, inability to clear stone burden, stent discomfort and pain.   He may continue ibuprofen, he has Vicodin to use as needed as well. He will continue staying hydrated and straining his urine. He will alert me with worsening poorly controlled symptoms or interval stone material passage. Appropriate return to clinic and ED follow-up instructions provided for worsening symptomology, painful inability to void and correlating signs/symptoms of systemic infection.        Next Appointment:      Next Appointment: 05/09/2024 10:30 AM    Appointment Type: KUB    Location: [facility] [phone]    Provider: KUB KUB    Reason for Visit: 1 Year - KUB, Follow up MD, Office Visit      * Signed by Ubaldo Eagles, NP on  02/21/24 at 5:12 PM (EDT)*  Addendum: Urine culture no growth x 2

## 2024-03-05 NOTE — Anesthesia Preprocedure Evaluation (Signed)
 Anesthesia Evaluation  Patient identified by MRN, date of birth, ID band Patient awake    Reviewed: Allergy & Precautions, NPO status , Patient's Chart, lab work & pertinent test results  History of Anesthesia Complications Negative for: history of anesthetic complications  Airway Mallampati: II  TM Distance: >3 FB Neck ROM: Full    Dental no notable dental hx. (+) Teeth Intact, Dental Advisory Given   Pulmonary former smoker   Pulmonary exam normal breath sounds clear to auscultation       Cardiovascular Exercise Tolerance: Good Normal cardiovascular exam Rhythm:Regular Rate:Normal     Neuro/Psych  Neuromuscular disease  negative psych ROS   GI/Hepatic ,GERD  Controlled,,  Endo/Other  negative endocrine ROS    Renal/GU Renal InsufficiencyRenal diseaseLab Results      Component                Value               Date                             K                        4.3                 02/23/2024                            CREATININE               1.33                02/23/2024                GFR                      53.51 (L)           02/23/2024                   Musculoskeletal   Abdominal   Peds  Hematology Lab Results      Component                Value               Date                      WBC                      5.6                 02/23/2024                HGB                      14.1                02/23/2024                HCT                      41.2                02/23/2024                MCV  94.3                02/23/2024                PLT                      242.0               02/23/2024              Anesthesia Other Findings   Reproductive/Obstetrics                              Anesthesia Physical Anesthesia Plan  ASA: 2  Anesthesia Plan: General   Post-op Pain Management: Ofirmev  IV (intra-op)* and Precedex   Induction:  Intravenous  PONV Risk Score and Plan: 3 and Treatment may vary due to age or medical condition, Ondansetron , Midazolam  and Dexamethasone  Airway Management Planned: LMA  Additional Equipment: None  Intra-op Plan:   Post-operative Plan:   Informed Consent: I have reviewed the patients History and Physical, chart, labs and discussed the procedure including the risks, benefits and alternatives for the proposed anesthesia with the patient or authorized representative who has indicated his/her understanding and acceptance.     Dental advisory given  Plan Discussed with: CRNA and Surgeon  Anesthesia Plan Comments:          Anesthesia Quick Evaluation

## 2024-03-06 ENCOUNTER — Ambulatory Visit (HOSPITAL_BASED_OUTPATIENT_CLINIC_OR_DEPARTMENT_OTHER): Payer: Self-pay | Admitting: Anesthesiology

## 2024-03-06 ENCOUNTER — Other Ambulatory Visit: Payer: Self-pay

## 2024-03-06 ENCOUNTER — Encounter (HOSPITAL_COMMUNITY): Payer: Self-pay | Admitting: Urology

## 2024-03-06 ENCOUNTER — Ambulatory Visit (HOSPITAL_COMMUNITY)
Admission: RE | Admit: 2024-03-06 | Discharge: 2024-03-06 | Disposition: A | Discharge status: Home / Self Care | Attending: Urology | Admitting: Urology

## 2024-03-06 ENCOUNTER — Encounter (HOSPITAL_COMMUNITY)
Admission: RE | Disposition: A | Discharge status: Home / Self Care | Payer: Self-pay | Source: Home / Self Care | Attending: Urology

## 2024-03-06 ENCOUNTER — Ambulatory Visit (HOSPITAL_COMMUNITY)

## 2024-03-06 ENCOUNTER — Ambulatory Visit (HOSPITAL_COMMUNITY): Payer: Self-pay | Admitting: Medical

## 2024-03-06 DIAGNOSIS — N201 Calculus of ureter: Secondary | ICD-10-CM | POA: Diagnosis not present

## 2024-03-06 DIAGNOSIS — Z87891 Personal history of nicotine dependence: Secondary | ICD-10-CM | POA: Insufficient documentation

## 2024-03-06 DIAGNOSIS — K219 Gastro-esophageal reflux disease without esophagitis: Secondary | ICD-10-CM | POA: Diagnosis not present

## 2024-03-06 HISTORY — PX: CYSTOSCOPY/URETEROSCOPY/HOLMIUM LASER/STENT PLACEMENT: SHX6546

## 2024-03-06 SURGERY — CYSTOSCOPY/URETEROSCOPY/HOLMIUM LASER/STENT PLACEMENT
Anesthesia: General | Site: Pelvis | Laterality: Left

## 2024-03-06 MED ORDER — ACETAMINOPHEN 10 MG/ML IV SOLN: 1000.0000 mg | Freq: Once | INTRAVENOUS | Status: DC | PRN

## 2024-03-06 MED ORDER — ONDANSETRON HCL 4 MG/2ML IJ SOLN
INTRAMUSCULAR | Status: DC | PRN
Start: 2024-03-06 — End: 2024-03-06
  Administered 2024-03-06: 4 mg via INTRAVENOUS

## 2024-03-06 MED ORDER — DEXAMETHASONE SODIUM PHOSPHATE 10 MG/ML IJ SOLN
INTRAMUSCULAR | Status: DC | PRN
  Administered 2024-03-06: 10 mg via INTRAVENOUS

## 2024-03-06 MED ORDER — ONDANSETRON HCL 4 MG/2ML IJ SOLN
INTRAMUSCULAR | Status: AC
  Filled 2024-03-06: qty 2

## 2024-03-06 MED ORDER — LIDOCAINE HCL (PF) 2 % IJ SOLN
INTRAMUSCULAR | Status: DC | PRN
  Administered 2024-03-06: 100 mg via INTRADERMAL

## 2024-03-06 MED ORDER — PROPOFOL 10 MG/ML IV BOLUS
INTRAVENOUS | Status: AC
  Filled 2024-03-06: qty 20

## 2024-03-06 MED ORDER — FENTANYL CITRATE PF 50 MCG/ML IJ SOSY: 25.0000 ug | PREFILLED_SYRINGE | INTRAMUSCULAR | Status: DC | PRN

## 2024-03-06 MED ORDER — CEFAZOLIN SODIUM-DEXTROSE 2-4 GM/100ML-% IV SOLN
2.0000 g | INTRAVENOUS | Status: AC
  Administered 2024-03-06: 2 g via INTRAVENOUS
  Filled 2024-03-06: qty 100

## 2024-03-06 MED ORDER — CHLORHEXIDINE GLUCONATE 0.12 % MT SOLN
15.0000 mL | Freq: Once | OROMUCOSAL | Status: AC
  Administered 2024-03-06: 15 mL via OROMUCOSAL

## 2024-03-06 MED ORDER — LACTATED RINGERS IV SOLN: INTRAVENOUS | Status: DC

## 2024-03-06 MED ORDER — PROPOFOL 10 MG/ML IV BOLUS
INTRAVENOUS | Status: DC | PRN
  Administered 2024-03-06: 150 mg via INTRAVENOUS

## 2024-03-06 MED ORDER — IOHEXOL 300 MG/ML  SOLN
INTRAMUSCULAR | Status: DC | PRN
  Administered 2024-03-06: 5 mL via URETHRAL

## 2024-03-06 MED ORDER — FENTANYL CITRATE (PF) 100 MCG/2ML IJ SOLN
INTRAMUSCULAR | Status: DC | PRN
  Administered 2024-03-06 (×2): 25 ug via INTRAVENOUS

## 2024-03-06 MED ORDER — ONDANSETRON HCL 4 MG/2ML IJ SOLN: 4.0000 mg | Freq: Once | INTRAMUSCULAR | Status: DC | PRN

## 2024-03-06 MED ORDER — ORAL CARE MOUTH RINSE: 15.0000 mL | Freq: Once | OROMUCOSAL | Status: AC

## 2024-03-06 MED ORDER — FENTANYL CITRATE (PF) 100 MCG/2ML IJ SOLN
INTRAMUSCULAR | Status: AC
  Filled 2024-03-06: qty 2

## 2024-03-06 MED ORDER — SODIUM CHLORIDE 0.9 % IR SOLN
Status: DC | PRN
  Administered 2024-03-06: 6000 mL via INTRAVESICAL

## 2024-03-06 MED ORDER — EPHEDRINE 5 MG/ML INJ
INTRAVENOUS | Status: AC
  Filled 2024-03-06: qty 5

## 2024-03-06 MED ORDER — LIDOCAINE HCL (PF) 2 % IJ SOLN
INTRAMUSCULAR | Status: AC
  Filled 2024-03-06: qty 5

## 2024-03-06 MED ORDER — DEXAMETHASONE SODIUM PHOSPHATE 10 MG/ML IJ SOLN
INTRAMUSCULAR | Status: AC
  Filled 2024-03-06: qty 1

## 2024-03-06 MED ORDER — EPHEDRINE SULFATE-NACL 50-0.9 MG/10ML-% IV SOSY
PREFILLED_SYRINGE | INTRAVENOUS | Status: DC | PRN
  Administered 2024-03-06: 10 mg via INTRAVENOUS

## 2024-03-06 SURGICAL SUPPLY — 17 items
BAG URO CATCHER STRL LF (MISCELLANEOUS) ×1 IMPLANT
BASKET ZERO TIP NITINOL 2.4FR (BASKET) IMPLANT
CATH URETL OPEN END 6FR 70 (CATHETERS) ×1 IMPLANT
CLOTH BEACON ORANGE TIMEOUT ST (SAFETY) ×1 IMPLANT
FIBER LASER MOSES 200 DFL (Laser) IMPLANT
GLOVE BIO SURGEON STRL SZ7.5 (GLOVE) ×1 IMPLANT
GOWN STRL REUS W/ TWL XL LVL3 (GOWN DISPOSABLE) ×1 IMPLANT
GUIDEWIRE STR DUAL SENSOR (WIRE) ×1 IMPLANT
GUIDEWIRE ZIPWRE .038 STRAIGHT (WIRE) IMPLANT
KIT TURNOVER KIT A (KITS) ×1 IMPLANT
MANIFOLD NEPTUNE II (INSTRUMENTS) ×1 IMPLANT
PACK CYSTO (CUSTOM PROCEDURE TRAY) ×1 IMPLANT
SHEATH NAVIGATOR HD 11/13X28 (SHEATH) IMPLANT
SHEATH NAVIGATOR HD 11/13X36 (SHEATH) IMPLANT
STENT URET 6FRX26 CONTOUR (STENTS) IMPLANT
TUBING CONNECTING 10 (TUBING) ×1 IMPLANT
TUBING UROLOGY SET (TUBING) ×1 IMPLANT

## 2024-03-06 NOTE — Anesthesia Postprocedure Evaluation (Signed)
 Anesthesia Post Note  Patient: Trevor Potter  Procedure(s) Performed: CYSTOSCOPY/URETEROSCOPY/HOLMIUM LASER/STENT PLACEMENT (Left: Pelvis)     Patient location during evaluation: PACU Anesthesia Type: General Level of consciousness: awake and alert Pain management: pain level controlled Vital Signs Assessment: post-procedure vital signs reviewed and stable Respiratory status: spontaneous breathing, nonlabored ventilation, respiratory function stable and patient connected to nasal cannula oxygen Cardiovascular status: blood pressure returned to baseline and stable Postop Assessment: no apparent nausea or vomiting Anesthetic complications: no   No notable events documented.  Last Vitals:  Vitals:   03/06/24 1345 03/06/24 1404  BP: 126/79 (!) 115/98  Pulse: 73   Resp: 16   Temp: 36.6 C 37 C  SpO2: 93% 95%    Last Pain:  Vitals:   03/06/24 1404  TempSrc:   PainSc: 0-No pain                 Garnette DELENA Gab

## 2024-03-06 NOTE — Anesthesia Procedure Notes (Signed)
 Procedure Name: LMA Insertion Date/Time: 03/06/2024 11:57 AM  Performed by: Carleton Garnette SAUNDERS, CRNAPre-anesthesia Checklist: Emergency Drugs available, Patient identified, Suction available, Patient being monitored and Timeout performed Patient Re-evaluated:Patient Re-evaluated prior to induction Oxygen Delivery Method: Circle system utilized Preoxygenation: Pre-oxygenation with 100% oxygen Induction Type: IV induction LMA: LMA inserted LMA Size: 4.0 Tube type: Oral Number of attempts: 1 Placement Confirmation: positive ETCO2 and breath sounds checked- equal and bilateral Tube secured with: Tape Dental Injury: Teeth and Oropharynx as per pre-operative assessment

## 2024-03-06 NOTE — Interval H&P Note (Signed)
 History and Physical Interval Note:  03/06/2024 11:38 AM  Trevor Potter  has presented today for surgery, with the diagnosis of LEFT URETERAL STONE.  The various methods of treatment have been discussed with the patient and family. After consideration of risks, benefits and other options for treatment, the patient has consented to  Procedure(s): CYSTOSCOPY/URETEROSCOPY/HOLMIUM LASER/STENT PLACEMENT (Left) as a surgical intervention.  The patient's history has been reviewed, patient examined, no change in status, stable for surgery.  He had has not seen a stone pass.  Pain improved.  No fever or dysuria.  No cough or congestion.  We did discuss he may need a staged procedure with a prestent.  I have reviewed the patient's chart and labs.  Questions were answered to the patient's satisfaction.     Donnice Brooks

## 2024-03-06 NOTE — Transfer of Care (Signed)
 Immediate Anesthesia Transfer of Care Note  Patient: Trevor Potter  Procedure(s) Performed: CYSTOSCOPY/URETEROSCOPY/HOLMIUM LASER/STENT PLACEMENT (Left: Pelvis)  Patient Location: PACU  Anesthesia Type:General  Level of Consciousness: awake, alert , and oriented  Airway & Oxygen Therapy: Patient Spontanous Breathing and Patient connected to face mask oxygen  Post-op Assessment: Report given to RN and Post -op Vital signs reviewed and stable  Post vital signs: Reviewed and stable  Last Vitals:  Vitals Value Taken Time  BP    Temp    Pulse    Resp    SpO2      Last Pain:  Vitals:   03/06/24 1059  TempSrc: Oral         Complications: No notable events documented.

## 2024-03-06 NOTE — Discharge Instructions (Signed)
 Removal of the stent: Remove the stent by pulling the string with slow steady pressure as instructed on Friday morning March 09, 2024.

## 2024-03-06 NOTE — Op Note (Signed)
 Preoperative diagnosis: Left ureteral stones  Postoperative diagnosis: Same  Procedure: Cystoscopy with left retrograde pyelogram, left ureteroscopy laser lithotripsy, stone basket extraction, left ureteral stent placement  Surgeon: Nieves  Anesthesia: General  Indication for procedure: Trevor Potter is a 73 year old male with symptomatic left ureteral stones.  He was brought today for above procedures.  Findings: On exam the glans and the meatus appeared normal penis was circumcised without lesion.  Scrotum appeared normal.  On DRE prostate was about 50 g and smooth without hard area or nodule.  On cystoscopy the urethra was unremarkable, borderline obstructing prostate with lateral lobe hypertrophy and a higher bladder neck.  There was a small stone noted in the bladder that was evacuated.  Left retrograde pyelogram-this outlined a single ureter single collecting system unit with filling defect in distal aspect of the distal ureter consistent with a stone and another 1 in the proximal aspect of the distal ureter consistent with a stone and proximal to that some mild dilation but no other filling defects.  Left ureteroscopy confirmed 2 stones in the distal left ureter.  Description of procedure: After consent was obtained patient brought to the operating room.  After adequate anesthesia he was placed in lithotomy position and prepped and draped in the usual sterile fashion.  Timeout was performed to confirm the patient and procedure.  The cystoscope was passed per urethra and the bladder inspected.  The stone in the bladder was drained.  The left ureteral orifice was cannulated with a 5 [place] open-ended catheter and left retrograde injection of contrast was performed.  A wire was then advanced and coiled in the upper calyx.  I then took a dual channel semirigid scope and a two42 m laser fiber carefully advanced into the left distal ureter until the first stone was noted.  The first stone encountered  in the ureter appeared small enough and it was grasped with a ZeroTip basket and removed and dropped into the bladder.  Then went back into the ureter and the larger most proximal stone was now down in the distal ureter and it was broken into 4 pieces and these were sequentially dropped in the bladder with a ZeroTip basket.  A final inspection up into the mid ureter noted there to be no injury and no other stone fragment visible as I look proximally.  We then backed out and noted again the ureter to look good without injury on the way out.  The wire was backloaded on the cystoscope and a 626 cm stent was advanced with a good coil seen up in the kidney and a good coil in the bladder.  The bladder was drained and the scope removed.  He exam under anesthesia performed.  He was awakened and taken to the cover him in stable condition.  Complications: None  Blood loss: Minimal  Specimens to lab: Stone fragments  Drains: 6 x 26 cm left ureteral stent with tether  Disposition: Patient stable to PACU.

## 2024-03-07 ENCOUNTER — Encounter (HOSPITAL_COMMUNITY): Payer: Self-pay | Admitting: Urology

## 2024-03-15 LAB — STONE ANALYSIS
Calcium Oxalate Monohydrate: 100 %
Weight Calculi: 193 mg

## 2024-03-28 DIAGNOSIS — M25512 Pain in left shoulder: Secondary | ICD-10-CM | POA: Diagnosis not present

## 2024-04-04 DIAGNOSIS — N401 Enlarged prostate with lower urinary tract symptoms: Secondary | ICD-10-CM | POA: Diagnosis not present

## 2024-04-04 DIAGNOSIS — R972 Elevated prostate specific antigen [PSA]: Secondary | ICD-10-CM | POA: Diagnosis not present

## 2024-04-04 DIAGNOSIS — N200 Calculus of kidney: Secondary | ICD-10-CM | POA: Diagnosis not present

## 2025-03-01 ENCOUNTER — Encounter: Admitting: Family Medicine
